# Patient Record
Sex: Female | Born: 1970 | Race: White | Hispanic: No | Marital: Single | Smoking: Never smoker
Health system: Southern US, Community
[De-identification: ages and names within clinical notes are randomized; demographics above are authoritative.]

## PROBLEM LIST (undated history)

## (undated) DIAGNOSIS — R112 Nausea with vomiting, unspecified: Secondary | ICD-10-CM

## (undated) DIAGNOSIS — E7800 Pure hypercholesterolemia, unspecified: Secondary | ICD-10-CM

## (undated) DIAGNOSIS — Z9889 Other specified postprocedural states: Secondary | ICD-10-CM

## (undated) DIAGNOSIS — M25561 Pain in right knee: Secondary | ICD-10-CM

## (undated) DIAGNOSIS — M2550 Pain in unspecified joint: Secondary | ICD-10-CM

## (undated) DIAGNOSIS — R7303 Prediabetes: Secondary | ICD-10-CM

## (undated) DIAGNOSIS — Z87442 Personal history of urinary calculi: Secondary | ICD-10-CM

## (undated) DIAGNOSIS — K259 Gastric ulcer, unspecified as acute or chronic, without hemorrhage or perforation: Secondary | ICD-10-CM

## (undated) DIAGNOSIS — K219 Gastro-esophageal reflux disease without esophagitis: Secondary | ICD-10-CM

## (undated) DIAGNOSIS — N289 Disorder of kidney and ureter, unspecified: Secondary | ICD-10-CM

## (undated) DIAGNOSIS — G5600 Carpal tunnel syndrome, unspecified upper limb: Secondary | ICD-10-CM

## (undated) HISTORY — DX: Pure hypercholesterolemia, unspecified: E78.00

## (undated) HISTORY — PX: OTHER SURGICAL HISTORY: SHX169

## (undated) HISTORY — DX: Gastric ulcer, unspecified as acute or chronic, without hemorrhage or perforation: K25.9

## (undated) HISTORY — DX: Pain in left knee: M25.561

## (undated) HISTORY — DX: Pain in unspecified joint: M25.50

## (undated) HISTORY — DX: Carpal tunnel syndrome, unspecified upper limb: G56.00

## (undated) HISTORY — DX: Gastro-esophageal reflux disease without esophagitis: K21.9

## (undated) HISTORY — PX: KNEE SURGERY: SHX244

## (undated) HISTORY — DX: Prediabetes: R73.03

---

## 1993-10-05 HISTORY — PX: KNEE SURGERY: SHX244

## 2000-08-02 ENCOUNTER — Other Ambulatory Visit: Admission: RE | Admit: 2000-08-02 | Discharge: 2000-08-02 | Payer: Self-pay | Admitting: Gynecology

## 2000-09-07 ENCOUNTER — Other Ambulatory Visit: Admission: RE | Admit: 2000-09-07 | Discharge: 2000-09-07 | Payer: Self-pay | Admitting: Gynecology

## 2000-12-07 ENCOUNTER — Other Ambulatory Visit: Admission: RE | Admit: 2000-12-07 | Discharge: 2000-12-07 | Payer: Self-pay | Admitting: Gynecology

## 2001-08-09 ENCOUNTER — Other Ambulatory Visit: Admission: RE | Admit: 2001-08-09 | Discharge: 2001-08-09 | Payer: Self-pay | Admitting: Gynecology

## 2001-08-18 ENCOUNTER — Encounter: Admission: RE | Admit: 2001-08-18 | Discharge: 2001-08-18 | Payer: Self-pay | Admitting: Gynecology

## 2001-08-18 ENCOUNTER — Encounter: Payer: Self-pay | Admitting: Gynecology

## 2001-11-27 ENCOUNTER — Encounter: Payer: Self-pay | Admitting: *Deleted

## 2001-11-27 ENCOUNTER — Emergency Department (HOSPITAL_COMMUNITY): Admission: EM | Admit: 2001-11-27 | Discharge: 2001-11-27 | Payer: Self-pay | Admitting: *Deleted

## 2002-02-04 ENCOUNTER — Emergency Department (HOSPITAL_COMMUNITY): Admission: EM | Admit: 2002-02-04 | Discharge: 2002-02-04 | Payer: Self-pay | Admitting: Emergency Medicine

## 2002-04-03 ENCOUNTER — Other Ambulatory Visit: Admission: RE | Admit: 2002-04-03 | Discharge: 2002-04-03 | Payer: Self-pay | Admitting: Gynecology

## 2002-11-05 ENCOUNTER — Inpatient Hospital Stay (HOSPITAL_COMMUNITY): Admission: AD | Admit: 2002-11-05 | Discharge: 2002-11-05 | Payer: Self-pay | Admitting: Gynecology

## 2002-11-14 ENCOUNTER — Other Ambulatory Visit: Admission: RE | Admit: 2002-11-14 | Discharge: 2002-11-14 | Payer: Self-pay | Admitting: Gynecology

## 2006-11-03 ENCOUNTER — Other Ambulatory Visit: Admission: RE | Admit: 2006-11-03 | Discharge: 2006-11-03 | Payer: Self-pay | Admitting: Gynecology

## 2006-12-24 ENCOUNTER — Emergency Department (HOSPITAL_COMMUNITY): Admission: EM | Admit: 2006-12-24 | Discharge: 2006-12-24 | Payer: Self-pay | Admitting: Emergency Medicine

## 2008-07-28 ENCOUNTER — Emergency Department (HOSPITAL_COMMUNITY): Admission: EM | Admit: 2008-07-28 | Discharge: 2008-07-28 | Payer: Self-pay | Admitting: Family Medicine

## 2010-10-17 ENCOUNTER — Emergency Department (HOSPITAL_COMMUNITY)
Admission: EM | Admit: 2010-10-17 | Discharge: 2010-10-17 | Payer: Self-pay | Source: Home / Self Care | Admitting: Family Medicine

## 2011-01-25 ENCOUNTER — Inpatient Hospital Stay (INDEPENDENT_AMBULATORY_CARE_PROVIDER_SITE_OTHER)
Admission: RE | Admit: 2011-01-25 | Discharge: 2011-01-25 | Disposition: A | Discharge status: Home / Self Care | Payer: Self-pay | Source: Ambulatory Visit | Attending: Family Medicine | Admitting: Family Medicine

## 2011-01-25 DIAGNOSIS — L509 Urticaria, unspecified: Secondary | ICD-10-CM

## 2011-01-25 DIAGNOSIS — J309 Allergic rhinitis, unspecified: Secondary | ICD-10-CM

## 2011-08-06 ENCOUNTER — Emergency Department (HOSPITAL_COMMUNITY)
Admission: EM | Admit: 2011-08-06 | Discharge: 2011-08-06 | Disposition: A | Discharge status: Home / Self Care | Payer: Self-pay | Attending: Emergency Medicine | Admitting: Emergency Medicine

## 2011-08-06 DIAGNOSIS — R110 Nausea: Secondary | ICD-10-CM | POA: Insufficient documentation

## 2011-08-06 DIAGNOSIS — R1013 Epigastric pain: Secondary | ICD-10-CM | POA: Insufficient documentation

## 2011-08-06 DIAGNOSIS — K219 Gastro-esophageal reflux disease without esophagitis: Secondary | ICD-10-CM | POA: Insufficient documentation

## 2015-03-02 ENCOUNTER — Emergency Department (HOSPITAL_COMMUNITY)
Admission: EM | Admit: 2015-03-02 | Discharge: 2015-03-02 | Disposition: A | Discharge status: Home / Self Care | Payer: Self-pay | Attending: Emergency Medicine | Admitting: Emergency Medicine

## 2015-03-02 ENCOUNTER — Encounter (HOSPITAL_COMMUNITY): Payer: Self-pay

## 2015-03-02 DIAGNOSIS — N200 Calculus of kidney: Secondary | ICD-10-CM | POA: Insufficient documentation

## 2015-03-02 DIAGNOSIS — Z3202 Encounter for pregnancy test, result negative: Secondary | ICD-10-CM | POA: Insufficient documentation

## 2015-03-02 HISTORY — DX: Disorder of kidney and ureter, unspecified: N28.9

## 2015-03-02 LAB — URINALYSIS, ROUTINE W REFLEX MICROSCOPIC
Bilirubin Urine: NEGATIVE
GLUCOSE, UA: NEGATIVE mg/dL
Ketones, ur: NEGATIVE mg/dL
Leukocytes, UA: NEGATIVE
NITRITE: NEGATIVE
PH: 5 (ref 5.0–8.0)
Protein, ur: NEGATIVE mg/dL
Specific Gravity, Urine: 1.03 (ref 1.005–1.030)
Urobilinogen, UA: 0.2 mg/dL (ref 0.0–1.0)

## 2015-03-02 LAB — COMPREHENSIVE METABOLIC PANEL
ALBUMIN: 3.6 g/dL (ref 3.5–5.0)
ALT: 13 U/L — ABNORMAL LOW (ref 14–54)
ANION GAP: 9 (ref 5–15)
AST: 16 U/L (ref 15–41)
Alkaline Phosphatase: 44 U/L (ref 38–126)
BILIRUBIN TOTAL: 0.1 mg/dL — AB (ref 0.3–1.2)
BUN: 17 mg/dL (ref 6–20)
CHLORIDE: 104 mmol/L (ref 101–111)
CO2: 25 mmol/L (ref 22–32)
Calcium: 8.8 mg/dL — ABNORMAL LOW (ref 8.9–10.3)
Creatinine, Ser: 0.86 mg/dL (ref 0.44–1.00)
GFR calc Af Amer: >60 mL/min (ref >60)
GFR calc non Af Amer: >60 mL/min (ref >60)
Glucose, Bld: 112 mg/dL — ABNORMAL HIGH (ref 65–99)
Potassium: 4.1 mmol/L (ref 3.5–5.1)
Sodium: 138 mmol/L (ref 135–145)
Total Protein: 6.5 g/dL (ref 6.5–8.1)

## 2015-03-02 LAB — CBC
HCT: 41.8 % (ref 36.0–46.0)
Hemoglobin: 13.7 g/dL (ref 12.0–15.0)
MCH: 30.4 pg (ref 26.0–34.0)
MCHC: 32.8 g/dL (ref 30.0–36.0)
MCV: 92.9 fL (ref 78.0–100.0)
Platelets: 278 10*3/uL (ref 150–400)
RBC: 4.5 MIL/uL (ref 3.87–5.11)
RDW: 13.6 % (ref 11.5–15.5)
WBC: 6.2 10*3/uL (ref 4.0–10.5)

## 2015-03-02 LAB — URINE MICROSCOPIC-ADD ON

## 2015-03-02 LAB — POC URINE PREG, ED: Preg Test, Ur: NEGATIVE

## 2015-03-02 MED ORDER — KETOROLAC TROMETHAMINE 30 MG/ML IJ SOLN
30.0000 mg | Freq: Once | INTRAMUSCULAR | Status: AC
  Administered 2015-03-02: 30 mg via INTRAVENOUS
  Filled 2015-03-02: qty 1

## 2015-03-02 MED ORDER — OXYCODONE-ACETAMINOPHEN 5-325 MG PO TABS
2.0000 | ORAL_TABLET | Freq: Once | ORAL | Status: AC
  Administered 2015-03-02: 2 via ORAL
  Filled 2015-03-02: qty 2

## 2015-03-02 MED ORDER — MORPHINE SULFATE 4 MG/ML IJ SOLN
4.0000 mg | Freq: Once | INTRAMUSCULAR | Status: AC
  Administered 2015-03-02: 4 mg via INTRAVENOUS
  Filled 2015-03-02: qty 1

## 2015-03-02 MED ORDER — METOCLOPRAMIDE HCL 5 MG/ML IJ SOLN
10.0000 mg | INTRAMUSCULAR | Status: AC
  Administered 2015-03-02: 10 mg via INTRAVENOUS
  Filled 2015-03-02: qty 2

## 2015-03-02 MED ORDER — OXYCODONE-ACETAMINOPHEN 5-325 MG PO TABS: 1.0000 | ORAL_TABLET | Freq: Four times a day (QID) | ORAL | Status: DC | PRN

## 2015-03-02 MED ORDER — ONDANSETRON HCL 4 MG PO TABS: 4.0000 mg | ORAL_TABLET | Freq: Four times a day (QID) | ORAL | Status: DC

## 2015-03-02 NOTE — ED Notes (Signed)
Pt c/o R flank pain starting two days ago, has had kidney stones there in the past, pt states she is unable to void, has been self medicating at home without relief and taking amoxicillin that belongs to mother.

## 2015-03-02 NOTE — ED Provider Notes (Signed)
CSN: [id number]     Arrival date & time 03/02/15  0330 History   First MD Initiated Contact with Patient 03/02/15 [phone]     Chief Complaint  Patient presents with  . Flank Pain    (Consider location/radiation/quality/duration/timing/severity/associated sxs/prior Treatment) HPI Comments: Patient is a 44 year old female with a history of kidney stones who presents to the emergency department for further evaluation of right flank pain. Patient states that she had symptoms mildly for the past 2 days, but they acutely worsened at 0145 this morning. Patient reports a sore pain in her R flank initially which progressed to severe pain in her right groin and right flank. She had some improvement in her pain when upright. This was associated with an urge to urinate as well as diaphoresis and nausea. Patient attempted to take Aleve for pain, without relief. Patient also took 5 tablets of her mother's left over amoxicillin over the course of 48 hours without improvement in her symptoms. She denies associated fever, diarrhea, melanoma or hematochezia, vomiting, or incontinence. She has a history of kidney stones in 2012.  Patient is a 44 y.o. female presenting with flank pain. The history is provided by the patient. No language interpreter was used.  Flank Pain Associated symptoms include abdominal pain (R groin), diaphoresis and nausea. Pertinent negatives include no fever or vomiting.    Past Medical History  Diagnosis Date  . Renal disorder     kidney stones    Past Surgical History  Procedure Laterality Date  . Knee surgery     No family history on file. History  Substance Use Topics  . Smoking status: Never Smoker   . Smokeless tobacco: Never Used  . Alcohol Use: No   OB History    No data available      Review of Systems  Constitutional: Positive for diaphoresis. Negative for fever.  Gastrointestinal: Positive for nausea and abdominal pain (R groin). Negative for vomiting.    Genitourinary: Positive for urgency and flank pain. Negative for dysuria and hematuria.  All other systems reviewed and are negative.   Allergies  Review of patient's allergies indicates no known allergies.  Home Medications   Prior to Admission medications   Medication Sig Start Date End Date Taking? Authorizing Provider  ondansetron (ZOFRAN) 4 MG tablet Take 1 tablet (4 mg total) by mouth every 6 (six) hours. 03/02/15   Antony MaduraKelly Irelynn Schermerhorn, PA-C  oxyCODONE-acetaminophen (PERCOCET/ROXICET) 5-325 MG per tablet Take 1-2 tablets by mouth every 6 (six) hours as needed for severe pain. 03/02/15   Antony MaduraKelly Macy Lingenfelter, PA-C   BP 127/78 mmHg  Pulse 96  Temp(Src) 97.9 F (36.6 C) (Oral)  Resp 24  Ht 5\' 6"  (1.676 m)  Wt 260 lb (117.935 kg)  BMI 41.99 kg/m2  SpO2 97%  LMP 02/09/2015   Physical Exam  Constitutional: She is oriented to person, place, and time. She appears well-developed and well-nourished. No distress.  HENT:  Head: Normocephalic and atraumatic.  Eyes: Conjunctivae and EOM are normal. No scleral icterus.  Neck: Normal range of motion.  Pulmonary/Chest: Effort normal. No respiratory distress.  Abdominal: Soft. Normal appearance. There is tenderness in the right lower quadrant and suprapubic area. There is CVA tenderness (Right). There is no tenderness at McBurney's point and negative Murphy's sign.    Soft abdomen with TTP in the RLQ and R mid abdomen as well as suprapubic region. There is R CVA tenderness to palpation. No peritoneal signs.  Musculoskeletal: Normal range of motion.  Neurological:  She is alert and oriented to person, place, and time.  Skin: Skin is warm and dry. No rash noted. She is not diaphoretic. No erythema. No pallor.  Psychiatric: She has a normal mood and affect. Her behavior is normal.  Nursing note and vitals reviewed.   ED Course  Procedures (including critical care time) Labs Review Labs Reviewed  COMPREHENSIVE METABOLIC PANEL - Abnormal; Notable for the  following:    Glucose, Bld 112 (*)    Calcium 8.8 (*)    ALT 13 (*)    Total Bilirubin 0.1 (*)    All other components within normal limits  URINALYSIS, ROUTINE W REFLEX MICROSCOPIC (NOT AT [facility]) - Abnormal; Notable for the following:    APPearance CLOUDY (*)    Hgb urine dipstick LARGE (*)    All other components within normal limits  URINE MICROSCOPIC-ADD ON - Abnormal; Notable for the following:    Squamous Epithelial / LPF MANY (*)    Bacteria, UA MANY (*)    Crystals CA OXALATE CRYSTALS (*)    All other components within normal limits  CBC  URINALYSIS, ROUTINE W REFLEX MICROSCOPIC (NOT AT [facility])  POC URINE PREG, ED    Imaging Review No results found.   EKG Interpretation None      MDM   Final diagnoses:  Kidney stone    Patient is a 45 year old female presenting to the emergency department for further evaluation of right flank pain which acutely worsened at 0145 this morning. High clinical suspicion for kidney stone. Symptoms consistent with this and patient states that pain feels similar to when she had a kidney stone in 2012. Patient without leukocytosis or evidence of UTI. Serum creatine WNL, vital signs stable and the pt does not have irratractable vomiting. Her pain has improved significantly with morphine and Toradol.   Given her lack of insurance, she voices preference to refrain from imaging if possible. I do not believe emergent imaging will aid in the management of this patient's symptoms, especially given high clinical suspicion for uncomplicated kidney stone. Will d/c home with pain medications and instruction for urology f/u; referral given. Return precautions discussed and provided. Patient agreeable to plan with no unaddressed concerns. Patient discharged in good condition; VSS.   Filed Vitals:   03/02/15 0338 03/02/15 0342  BP:  127/78  Pulse:  96  Temp:  97.9 F (36.6 C)  TempSrc:  Oral  Resp:  24  Height:  (1.676 m)   Weight: 260 lb (117.935  kg)   SpO2:  97%        Antony Madura, PA-C 03/02/15 0517  April Palumbo, MD 03/02/15 [phone]

## 2015-03-02 NOTE — Discharge Instructions (Signed)

## 2016-07-12 ENCOUNTER — Emergency Department (HOSPITAL_COMMUNITY): Payer: Self-pay

## 2016-07-12 ENCOUNTER — Encounter (HOSPITAL_COMMUNITY): Payer: Self-pay

## 2016-07-12 ENCOUNTER — Emergency Department (HOSPITAL_COMMUNITY)
Admission: EM | Admit: 2016-07-12 | Discharge: 2016-07-13 | Disposition: A | Discharge status: Home / Self Care | Payer: Self-pay | Attending: Emergency Medicine | Admitting: Emergency Medicine

## 2016-07-12 DIAGNOSIS — N201 Calculus of ureter: Secondary | ICD-10-CM | POA: Insufficient documentation

## 2016-07-12 DIAGNOSIS — N200 Calculus of kidney: Secondary | ICD-10-CM

## 2016-07-12 DIAGNOSIS — N939 Abnormal uterine and vaginal bleeding, unspecified: Secondary | ICD-10-CM | POA: Insufficient documentation

## 2016-07-12 LAB — CBC WITH DIFFERENTIAL/PLATELET
Basophils Absolute: 0 10*3/uL (ref 0.0–0.1)
Basophils Relative: 0 %
EOS ABS: 0.1 10*3/uL (ref 0.0–0.7)
Eosinophils Relative: 1 %
HCT: 37.1 % (ref 36.0–46.0)
Hemoglobin: 12.2 g/dL (ref 12.0–15.0)
LYMPHS ABS: 1.9 10*3/uL (ref 0.7–4.0)
Lymphocytes Relative: 29 %
MCH: 30.3 pg (ref 26.0–34.0)
MCHC: 32.9 g/dL (ref 30.0–36.0)
MCV: 92.3 fL (ref 78.0–100.0)
Monocytes Absolute: 0.6 10*3/uL (ref 0.1–1.0)
Monocytes Relative: 9 %
Neutro Abs: 4.1 10*3/uL (ref 1.7–7.7)
Neutrophils Relative %: 61 %
PLATELETS: 268 10*3/uL (ref 150–400)
RBC: 4.02 MIL/uL (ref 3.87–5.11)
RDW: 13.3 % (ref 11.5–15.5)
WBC: 6.7 10*3/uL (ref 4.0–10.5)

## 2016-07-12 LAB — COMPREHENSIVE METABOLIC PANEL
ALK PHOS: 39 U/L (ref 38–126)
ALT: 14 U/L (ref 14–54)
AST: 15 U/L (ref 15–41)
Albumin: 3.6 g/dL (ref 3.5–5.0)
Anion gap: 5 (ref 5–15)
BILIRUBIN TOTAL: 0.4 mg/dL (ref 0.3–1.2)
BUN: 13 mg/dL (ref 6–20)
CO2: 25 mmol/L (ref 22–32)
Calcium: 8.4 mg/dL — ABNORMAL LOW (ref 8.9–10.3)
Chloride: 105 mmol/L (ref 101–111)
Creatinine, Ser: 0.75 mg/dL (ref 0.44–1.00)
GFR calc Af Amer: >60 mL/min (ref >60)
Glucose, Bld: 103 mg/dL — ABNORMAL HIGH (ref 65–99)
Potassium: 3.9 mmol/L (ref 3.5–5.1)
Sodium: 135 mmol/L (ref 135–145)
Total Protein: 6.6 g/dL (ref 6.5–8.1)

## 2016-07-12 LAB — URINALYSIS, ROUTINE W REFLEX MICROSCOPIC
Glucose, UA: NEGATIVE mg/dL
Ketones, ur: NEGATIVE mg/dL
LEUKOCYTES UA: NEGATIVE
Nitrite: NEGATIVE
PH: 6.5 (ref 5.0–8.0)
Protein, ur: 30 mg/dL — AB
SPECIFIC GRAVITY, URINE: 1.02 (ref 1.005–1.030)

## 2016-07-12 LAB — URINE MICROSCOPIC-ADD ON

## 2016-07-12 LAB — LIPASE, BLOOD: Lipase: 17 U/L (ref 11–51)

## 2016-07-12 LAB — PREGNANCY, URINE: Preg Test, Ur: NEGATIVE

## 2016-07-12 MED ORDER — KETOROLAC TROMETHAMINE 30 MG/ML IJ SOLN
30.0000 mg | Freq: Once | INTRAMUSCULAR | Status: AC
  Administered 2016-07-12: 30 mg via INTRAVENOUS
  Filled 2016-07-12: qty 1

## 2016-07-12 MED ORDER — SODIUM CHLORIDE 0.9 % IV BOLUS (SEPSIS)
1000.0000 mL | Freq: Once | INTRAVENOUS | Status: AC
  Administered 2016-07-12: 1000 mL via INTRAVENOUS

## 2016-07-12 MED ORDER — ONDANSETRON HCL 4 MG/2ML IJ SOLN
4.0000 mg | Freq: Once | INTRAMUSCULAR | Status: AC
  Administered 2016-07-12: 4 mg via INTRAVENOUS
  Filled 2016-07-12: qty 2

## 2016-07-12 MED ORDER — HYDROMORPHONE HCL 1 MG/ML IJ SOLN
0.5000 mg | Freq: Once | INTRAMUSCULAR | Status: AC
  Administered 2016-07-12: 0.5 mg via INTRAVENOUS
  Filled 2016-07-12: qty 1

## 2016-07-12 MED ORDER — METOCLOPRAMIDE HCL 5 MG/ML IJ SOLN
5.0000 mg | Freq: Once | INTRAMUSCULAR | Status: AC
  Administered 2016-07-12: 5 mg via INTRAVENOUS
  Filled 2016-07-12: qty 2

## 2016-07-12 NOTE — ED Notes (Signed)
Pt returned from CT at this time.  

## 2016-07-12 NOTE — ED Triage Notes (Signed)
Patient states that she has a kidney stone and is hurting on her left side.  States that she took a Hydrocodone around 1630 and that it did not help.  Has seen blood in her urine.  Has a prior history of kidney stones, but never one that has hurt on the left side.

## 2016-07-12 NOTE — ED Provider Notes (Signed)
[facility]-EMERGENCY DEPT Provider Note   CSN: [id number] Arrival date & time: 07/12/16  1939     History   Chief Complaint Chief Complaint  Patient presents with  . Nephrolithiasis    HPI Tiffany Young is a 45 y.o. female.  Tiffany Young is a 45 y.o. female with h/o nephrolithiasis presents to ED with complaint of left flank pain onset this afternoon. Pain was initially intermittent, but has been constant since approximately 3pm. She has aching low back pain with sharp pains throughout her left flank. She reports associated hematuria, nausea, and left sided abdominal pain. She denies fever, trouble swallowing, changes in vision, chest pain, shortness of breath, vomiting, dysuria, vaginal discharge, numbness or weakness, loss of bowel or bladder control. She has tried lemon water with minimal relief. She took a vicodin at approximately 4:30pm with some relief.       Past Medical History:  Diagnosis Date  . Renal disorder    kidney stones     There are no active problems to display for this patient.   Past Surgical History:  Procedure Laterality Date  . KNEE SURGERY      OB History    No data available       Home Medications    Prior to Admission medications   Medication Sig Start Date End Date Taking? Authorizing Provider  cephALEXin (KEFLEX) 500 MG capsule Take 1 capsule (500 mg total) by mouth 2 (two) times daily. 2016/07/18 07/16/16  Lona Kettle, PA-C  HYDROcodone-acetaminophen (NORCO/VICODIN) 5-325 MG tablet Take 1 tablet by mouth every 6 (six) hours as needed. 2016/07/18   Lona Kettle, PA-C  ondansetron (ZOFRAN ODT) 4 MG disintegrating tablet Take 1 tablet (4 mg total) by mouth every 8 (eight) hours as needed for nausea or vomiting. July 18, 2016   Lona Kettle, PA-C  tamsulosin (FLOMAX) 0.4 MG CAPS capsule Take 1 capsule (0.4 mg total) by mouth daily. Until stone has passed 07-18-2016   Lona Kettle, PA-C    Family  History No family history on file.  Social History Social History  Substance Use Topics  . Smoking status: Never Smoker  . Smokeless tobacco: Never Used  . Alcohol use No     Allergies   Review of patient's allergies indicates no known allergies.   Review of Systems Review of Systems  Constitutional: Negative for chills, diaphoresis and fever.  HENT: Negative for trouble swallowing.   Eyes: Negative for visual disturbance.  Respiratory: Negative for shortness of breath.   Cardiovascular: Negative for chest pain.  Gastrointestinal: Positive for abdominal pain ( left abdomen) and nausea. Negative for diarrhea and vomiting.  Genitourinary: Positive for dysuria, flank pain, hematuria and vaginal bleeding ( just finishing menstrual cycle).  Musculoskeletal: Positive for back pain.  Skin: Negative for rash.  Neurological: Negative for weakness, numbness and headaches.     Physical Exam Updated Vital Signs BP 127/70 (BP Location: Left Arm)   Pulse 95   Temp 98 F (36.7 C) (Oral)   Resp 16   Ht 5\' 6"  (1.676 m)   Wt 122.5 kg   LMP 06/30/2016 (Approximate) Comment: NEG U PREG 07/12/16  SpO2 96%   BMI 43.58 kg/m   Physical Exam  Constitutional: She appears well-developed and well-nourished.  HENT:  Head: Normocephalic and atraumatic.  Mouth/Throat: Oropharynx is clear and moist. No oropharyngeal exudate.  Eyes: Conjunctivae and EOM are normal. Pupils are equal, round, and reactive to light. Right eye exhibits no discharge. Left eye  exhibits no discharge. No scleral icterus.  Neck: Normal range of motion and phonation normal. Neck supple. No neck rigidity. Normal range of motion present.  Cardiovascular: Normal rate, regular rhythm, normal heart sounds and intact distal pulses.   No murmur heard. Pulmonary/Chest: Effort normal and breath sounds normal. No stridor. No respiratory distress. She has no wheezes. She has no rales.  Abdominal: Soft. Bowel sounds are normal. She  exhibits no distension. There is no tenderness. There is no rigidity, no rebound, no guarding and no CVA tenderness.  Musculoskeletal: Normal range of motion.  Lymphadenopathy:    She has no cervical adenopathy.  Neurological: She is alert. She is not disoriented. Coordination and gait normal. GCS eye subscore is 4. GCS verbal subscore is 5. GCS motor subscore is 6.  Skin: Skin is warm and dry. No rash noted. She is not diaphoretic.  Psychiatric: She has a normal mood and affect. Her behavior is normal.     ED Treatments / Results  Labs (all labs ordered are listed, but only abnormal results are displayed) Labs Reviewed  URINALYSIS, ROUTINE W REFLEX MICROSCOPIC (NOT AT [facility]) - Abnormal; Notable for the following:       Result Value   Color, Urine RED (*)    APPearance CLOUDY (*)    Hgb urine dipstick LARGE (*)    Bilirubin Urine SMALL (*)    Protein, ur 30 (*)    All other components within normal limits  COMPREHENSIVE METABOLIC PANEL - Abnormal; Notable for the following:    Glucose, Bld 103 (*)    Calcium 8.4 (*)    All other components within normal limits  URINE MICROSCOPIC-ADD ON - Abnormal; Notable for the following:    Squamous Epithelial / LPF 6-30 (*)    Bacteria, UA FEW (*)    All other components within normal limits  URINE CULTURE  CBC WITH DIFFERENTIAL/PLATELET  LIPASE, BLOOD  PREGNANCY, URINE    EKG  EKG Interpretation None       Radiology Ct Renal Stone Study  Result Date: 07/12/2016 CLINICAL DATA:  Sharp constant left-sided flank pain and hematuria. Initial encounter. EXAM: CT ABDOMEN AND PELVIS WITHOUT CONTRAST TECHNIQUE: Multidetector CT imaging of the abdomen and pelvis was performed following the standard protocol without IV contrast. COMPARISON:  CT of the abdomen and pelvis from 12/24/2006 FINDINGS: Lower chest: The visualized lung bases are grossly clear. The visualized portions of the mediastinum are unremarkable. Hepatobiliary: The liver is  unremarkable in appearance. The gallbladder is unremarkable in appearance. The common bile duct remains normal in caliber. Pancreas: The pancreas is within normal limits. Spleen: The spleen is unremarkable in appearance. Adrenals/Urinary Tract: The adrenal glands are unremarkable in appearance. The kidneys are within normal limits. There is no evidence of hydronephrosis. No renal or ureteral stones are identified. No perinephric stranding is seen Minimal left-sided hydronephrosis is noted, with prominence of the left ureter. An obstructing 4 mm stone is noted at the distal left ureter, just above the left vesicoureteral junction. The right kidney is unremarkable in appearance. No nonobstructing renal stones are identified. Stomach/Bowel: The stomach is unremarkable in appearance. The small bowel is within normal limits. The appendix is normal in caliber, without evidence of appendicitis. The colon is unremarkable in appearance. Vascular/Lymphatic: The abdominal aorta is unremarkable in appearance. The inferior vena cava is grossly unremarkable. No retroperitoneal lymphadenopathy is seen. No pelvic sidewall lymphadenopathy is identified. Reproductive: The bladder is mildly distended and within normal limits. The uterus is grossly  unremarkable in appearance. The ovaries are relatively symmetric. No suspicious adnexal masses are seen. Other: No additional soft tissue abnormalities are seen. Musculoskeletal: No acute osseous abnormalities are identified. The visualized musculature is unremarkable in appearance. IMPRESSION: Minimal left-sided hydronephrosis, with an obstructing 4 mm stone noted at the distal left ureter, just above the left vesicoureteral junction. Electronically Signed   By: Roanna Raider M.D.   On: 07/12/2016 23:20    Procedures Procedures (including critical care time)  Medications Ordered in ED Medications  sodium chloride 0.9 % bolus 1,000 mL (0 mLs Intravenous Stopped 07/12/16 2142)    HYDROmorphone (DILAUDID) injection 0.5 mg (0.5 mg Intravenous Given 07/12/16 2049)  ondansetron (ZOFRAN) injection 4 mg (4 mg Intravenous Given 07/12/16 2049)  metoCLOPramide (REGLAN) injection 5 mg (5 mg Intravenous Given 07/12/16 2200)  ketorolac (TORADOL) 30 MG/ML injection 30 mg (30 mg Intravenous Given 07/12/16 2200)  HYDROcodone-acetaminophen (NORCO/VICODIN) 5-325 MG per tablet 1 tablet (1 tablet Oral Given 07/13/16 0116)  cephALEXin (KEFLEX) capsule 500 mg (500 mg Oral Given 07/13/16 0115)     Initial Impression / Assessment and Plan / ED Course  I have reviewed the triage vital signs and the nursing notes.  Pertinent labs & imaging results that were available during my care of the patient were reviewed by me and considered in my medical decision making (see chart for details).  Clinical Course  Value Comment By Time   On re-evaluation patient endorses slight improvement in pain.  Lona Kettle, [place] 10/08 2141  CT Renal Stone Study Reviewed Lona Kettle, PA-C 10/08 2330   On re-evaluation, patient endorses improvement in pain.  Lona Kettle, [place] 10/09 0030    Patient presents to ED with complaint of left flank pain onset today. Patient is afebrile and non-toxic appearing. She appears uncomfortable, pacing the room. Physical exam is re-assuring. Abdomen is soft and non-tender. No CVA tenderness. Given h/o nephrolithiasis and current s/s concern for possible nephrolithiasis. Will check basic labs. IVF and pain medication initiated.   On re-evaluation patient endorses improvement in pain. CBC, CMP, lipase re-assuring. Negative pregnancy test. U/A remarkable for hematuria; some WBC and bacteria noted; however, squamous epithelial are present, patient does endorse some dysuria.  CT remarkable for 4mm stone in left distal ureter above vesicouretral junction with mild left hydronephrosis. Toradol given in ED. Given bacteria in urine and stone consulted urology - given normal  WBC and afebrile no emergent intervention needed at this time, greatly appreciate urology's time and input.   Discussed results and plan with patient. Strain urine. Ibuprofen for pain relief. Rx vicodin for severe pain. Zofran for nausea. Rx tamsulosin to facilitate stone passage. Rx ABX for ?UTI given bacteria and dysuria, will culture urine as well. Strain urine. Follow up with urology given recurrent episodes of nephrolithiasis. Return precautions given. Patient voiced understanding and is agreeable.   Final Clinical Impressions(s) / ED Diagnoses   Final diagnoses:  Nephrolithiasis    New Prescriptions New Prescriptions   CEPHALEXIN (KEFLEX) 500 MG CAPSULE    Take 1 capsule (500 mg total) by mouth 2 (two) times daily.   HYDROCODONE-ACETAMINOPHEN (NORCO/VICODIN) 5-325 MG TABLET    Take 1 tablet by mouth every 6 (six) hours as needed.   ONDANSETRON (ZOFRAN ODT) 4 MG DISINTEGRATING TABLET    Take 1 tablet (4 mg total) by mouth every 8 (eight) hours as needed for nausea or vomiting.   TAMSULOSIN (FLOMAX) 0.4 MG CAPS CAPSULE    Take 1 capsule (0.4  mg total) by mouth daily. Until stone has passed     Lona Kettle, PA-C 07/13/16 0120    Glynn Octave, MD 07/13/16 [phone]

## 2016-07-13 MED ORDER — HYDROCODONE-ACETAMINOPHEN 5-325 MG PO TABS: 1.0000 | ORAL_TABLET | Freq: Four times a day (QID) | ORAL | 0 refills | Status: DC | PRN

## 2016-07-13 MED ORDER — CEPHALEXIN 500 MG PO CAPS
500.0000 mg | ORAL_CAPSULE | Freq: Once | ORAL | Status: AC
  Administered 2016-07-13: 500 mg via ORAL
  Filled 2016-07-13: qty 1

## 2016-07-13 MED ORDER — CEPHALEXIN 500 MG PO CAPS: 500.0000 mg | ORAL_CAPSULE | Freq: Two times a day (BID) | ORAL | 0 refills | Status: AC

## 2016-07-13 MED ORDER — ONDANSETRON 4 MG PO TBDP: 4.0000 mg | ORAL_TABLET | Freq: Three times a day (TID) | ORAL | 0 refills | Status: DC | PRN

## 2016-07-13 MED ORDER — HYDROCODONE-ACETAMINOPHEN 5-325 MG PO TABS
1.0000 | ORAL_TABLET | Freq: Once | ORAL | Status: AC
  Administered 2016-07-13: 1 via ORAL
  Filled 2016-07-13: qty 1

## 2016-07-13 MED ORDER — TAMSULOSIN HCL 0.4 MG PO CAPS: 0.4000 mg | ORAL_CAPSULE | Freq: Every day | ORAL | 0 refills | Status: DC

## 2016-07-13 NOTE — Discharge Instructions (Signed)
Read the information below.  You have a kidney stone. Because you are having some burning with urination and do show some bacteria in your urine, you are being started on antibiotics. You can take ibuprofen 400mg  every 6hrs as needed. I have prescribed vicodin for severe pain. I have also prescribed zofran for nausea. I have prescribed tamsulosin to help facilitate passing the stone.  It is important that you strain your urine.  It is also important that you follow up with a urologist for re-evaluation, I have provided the contact information above. Please call on Monday to schedule an appointment.  Use the prescribed medication as directed.  Please discuss all new medications with your pharmacist.   You may return to the Emergency Department at any time for worsening condition or any new symptoms that concern you. Return to ED if you develop fever, unable to keep food/fluids down, are not able to urinate, or develop and other new/concerning symptoms.

## 2016-07-14 LAB — URINE CULTURE

## 2019-03-26 ENCOUNTER — Emergency Department (HOSPITAL_COMMUNITY): Payer: Self-pay

## 2019-03-26 ENCOUNTER — Emergency Department (HOSPITAL_COMMUNITY)
Admission: EM | Admit: 2019-03-26 | Discharge: 2019-03-26 | Disposition: A | Discharge status: Home / Self Care | Payer: Self-pay | Attending: Emergency Medicine | Admitting: Emergency Medicine

## 2019-03-26 ENCOUNTER — Other Ambulatory Visit: Payer: Self-pay

## 2019-03-26 ENCOUNTER — Emergency Department (HOSPITAL_COMMUNITY): Admission: EM | Admit: 2019-03-26 | Discharge: 2019-03-26 | Discharge status: Absent without leave | Payer: Self-pay

## 2019-03-26 DIAGNOSIS — N201 Calculus of ureter: Secondary | ICD-10-CM | POA: Insufficient documentation

## 2019-03-26 LAB — COMPREHENSIVE METABOLIC PANEL
ALT: 15 U/L (ref 0–44)
AST: 18 U/L (ref 15–41)
Albumin: 3.9 g/dL (ref 3.5–5.0)
Alkaline Phosphatase: 41 U/L (ref 38–126)
Anion gap: 11 (ref 5–15)
BUN: 12 mg/dL (ref 6–20)
CO2: 24 mmol/L (ref 22–32)
Calcium: 8.9 mg/dL (ref 8.9–10.3)
Chloride: 102 mmol/L (ref 98–111)
Creatinine, Ser: 0.8 mg/dL (ref 0.44–1.00)
GFR calc Af Amer: >60 mL/min (ref >60)
GFR calc non Af Amer: >60 mL/min (ref >60)
Glucose, Bld: 104 mg/dL — ABNORMAL HIGH (ref 70–99)
Potassium: 4 mmol/L (ref 3.5–5.1)
Sodium: 137 mmol/L (ref 135–145)
Total Bilirubin: 0.6 mg/dL (ref 0.3–1.2)
Total Protein: 7 g/dL (ref 6.5–8.1)

## 2019-03-26 LAB — CBC WITH DIFFERENTIAL/PLATELET
Abs Immature Granulocytes: 0.01 10*3/uL (ref 0.00–0.07)
Basophils Absolute: 0 10*3/uL (ref 0.0–0.1)
Basophils Relative: 1 %
Eosinophils Absolute: 0 10*3/uL (ref 0.0–0.5)
Eosinophils Relative: 1 %
HCT: 39.9 % (ref 36.0–46.0)
Hemoglobin: 13.3 g/dL (ref 12.0–15.0)
Immature Granulocytes: 0 %
Lymphocytes Relative: 29 %
Lymphs Abs: 1.5 10*3/uL (ref 0.7–4.0)
MCH: 31.2 pg (ref 26.0–34.0)
MCHC: 33.3 g/dL (ref 30.0–36.0)
MCV: 93.7 fL (ref 80.0–100.0)
Monocytes Absolute: 0.3 10*3/uL (ref 0.1–1.0)
Monocytes Relative: 7 %
Neutro Abs: 3.2 10*3/uL (ref 1.7–7.7)
Neutrophils Relative %: 62 %
Platelets: 259 10*3/uL (ref 150–400)
RBC: 4.26 MIL/uL (ref 3.87–5.11)
RDW: 12.8 % (ref 11.5–15.5)
WBC: 5 10*3/uL (ref 4.0–10.5)
nRBC: 0 % (ref 0.0–0.2)

## 2019-03-26 LAB — URINALYSIS, ROUTINE W REFLEX MICROSCOPIC
Bilirubin Urine: NEGATIVE
Glucose, UA: NEGATIVE mg/dL
Ketones, ur: NEGATIVE mg/dL
Leukocytes,Ua: NEGATIVE
Nitrite: NEGATIVE
Protein, ur: NEGATIVE mg/dL
RBC / HPF: >50 RBC/hpf — ABNORMAL HIGH (ref 0–5)
Specific Gravity, Urine: 1.025 (ref 1.005–1.030)
pH: 5 (ref 5.0–8.0)

## 2019-03-26 LAB — LIPASE, BLOOD: Lipase: 25 U/L (ref 11–51)

## 2019-03-26 LAB — POC URINE PREG, ED: Preg Test, Ur: NEGATIVE

## 2019-03-26 MED ORDER — ONDANSETRON HCL 4 MG PO TABS: 4.0000 mg | ORAL_TABLET | Freq: Three times a day (TID) | ORAL | 0 refills | Status: DC | PRN

## 2019-03-26 MED ORDER — KETOROLAC TROMETHAMINE 30 MG/ML IJ SOLN
30.0000 mg | Freq: Once | INTRAMUSCULAR | Status: AC
  Administered 2019-03-26: 30 mg via INTRAVENOUS
  Filled 2019-03-26: qty 1

## 2019-03-26 MED ORDER — HYDROCODONE-ACETAMINOPHEN 5-325 MG PO TABS: 1.0000 | ORAL_TABLET | Freq: Four times a day (QID) | ORAL | 0 refills | Status: DC | PRN

## 2019-03-26 MED ORDER — ONDANSETRON HCL 4 MG/2ML IJ SOLN
4.0000 mg | Freq: Once | INTRAMUSCULAR | Status: AC
  Administered 2019-03-26: 4 mg via INTRAVENOUS
  Filled 2019-03-26: qty 2

## 2019-03-26 MED ORDER — IBUPROFEN 600 MG PO TABS: 600.0000 mg | ORAL_TABLET | Freq: Four times a day (QID) | ORAL | 0 refills | Status: DC | PRN

## 2019-03-26 NOTE — ED Triage Notes (Signed)
History of kidney stone.  Pain started with severe back on left yesterday.  Rates pain 5/10.  Took two of her daughters medications.  Took one Flomax yesterday, Zofran this am and oxycodone this am.  No blood in urine.

## 2019-03-26 NOTE — ED Provider Notes (Signed)
[facility] EMERGENCY DEPARTMENT Provider Note   CSN: [id number] Arrival date & time: 03/26/19  1330    History   Chief Complaint Chief Complaint  Patient presents with   Flank Pain    left    HPI Tiffany Young is a 48 y.o. female with history of nephrolithiasis presents today for evaluation of acute onset, progressively worsening back pain and left flank pain since yesterday.  She reports that pain is intermittent, sharp and aching, worsens by sitting still when it is its most severe.  She reports the pain goes across her entire back and at times radiates to the lower abdomen and left side.  She notes nausea but no vomiting.  She states that for a few days leading up to her pain she was also having some urinary symptoms including urgency and frequency.  No dysuria or hematuria.  She reports that when her urinary symptoms began she took a couple of tablets of doxycycline from her friend thinking that she might have had a UTI.  Yesterday she took half a tablet of Percocet, Flomax, and Zofran with some relief in her symptoms.  She took another half a tablet of Percocet this morning.  Reports this feels similar to prior kidney stone pains.     The history is provided by the patient.    Past Medical History:  Diagnosis Date   Renal disorder    kidney stones     There are no active problems to display for this patient.   Past Surgical History:  Procedure Laterality Date   KNEE SURGERY       OB History   No obstetric history on file.      Home Medications    Prior to Admission medications   Medication Sig Start Date End Date Taking? Authorizing Provider  cetirizine (ZYRTEC) 10 MG chewable tablet Chew 10 mg by mouth daily.   Yes [provider]  naproxen sodium (ALEVE) 220 MG tablet Take 440 mg by mouth daily as needed.   Yes [provider]  ondansetron (ZOFRAN ODT) 4 MG disintegrating tablet Take 1 tablet (4 mg total) by mouth every 8  (eight) hours as needed for nausea or vomiting. 07/13/16  Yes Deborha PaymentMeyer, Ashley L, PA-C  tamsulosin (FLOMAX) 0.4 MG CAPS capsule Take 1 capsule (0.4 mg total) by mouth daily. Until stone has passed Patient taking differently: Take 0.4 mg by mouth once. Until stone has passed 07/13/16  Yes Daphane ShepherdMeyer, Margaretmary DysAshley L, PA-C  HYDROcodone-acetaminophen (NORCO/VICODIN) 5-325 MG tablet Take 1 tablet by mouth every 6 (six) hours as needed for severe pain. 03/26/19   Efosa Treichler A, PA-C  ibuprofen (ADVIL) 600 MG tablet Take 1 tablet (600 mg total) by mouth every 6 (six) hours as needed. 03/26/19   Luevenia MaxinFawze, Tari Lecount A, PA-C  ondansetron (ZOFRAN) 4 MG tablet Take 1 tablet (4 mg total) by mouth every 8 (eight) hours as needed for nausea or vomiting. 03/26/19   Jeanie SewerFawze, Leighanna Kirn A, PA-C    Family History No family history on file.  Social History Social History   Tobacco Use   Smoking status: Never Smoker   Smokeless tobacco: Never Used  Substance Use Topics   Alcohol use: No   Drug use: Not on file     Allergies   Patient has no known allergies.   Review of Systems Review of Systems  Constitutional: Negative for chills and fever.  Respiratory: Negative for shortness of breath.   Cardiovascular: Negative for chest pain.  Gastrointestinal: Positive for abdominal pain, constipation and nausea. Negative for diarrhea and vomiting.  Genitourinary: Positive for flank pain, frequency and urgency. Negative for dysuria and hematuria.  All other systems reviewed and are negative.    Physical Exam Updated Vital Signs BP 127/65    Pulse 86    Temp 98.7 F (37.1 C) (Oral)    Resp 16    Ht 5\' 6"  (1.676 m)    Wt 114.3 kg    SpO2 97%    BMI 40.67 kg/m   Physical Exam Vitals signs and nursing note reviewed.  Constitutional:      General: She is not in acute distress.    Appearance: She is well-developed.  HENT:     Head: Normocephalic and atraumatic.  Eyes:     General:        Right eye: No discharge.        Left eye:  No discharge.     Conjunctiva/sclera: Conjunctivae normal.  Neck:     Vascular: No JVD.     Trachea: No tracheal deviation.  Cardiovascular:     Rate and Rhythm: Normal rate and regular rhythm.  Pulmonary:     Effort: Pulmonary effort is normal.     Breath sounds: Normal breath sounds.  Abdominal:     General: Abdomen is protuberant. Bowel sounds are normal. There is no distension.     Palpations: Abdomen is soft.     Tenderness: There is abdominal tenderness in the suprapubic area, left upper quadrant and left lower quadrant. There is left CVA tenderness. There is no right CVA tenderness, guarding or rebound.  Musculoskeletal:       Arms:  Skin:    General: Skin is warm and dry.     Findings: No erythema.  Neurological:     Mental Status: She is alert.  Psychiatric:        Behavior: Behavior normal.      ED Treatments / Results  Labs (all labs ordered are listed, but only abnormal results are displayed) Labs Reviewed  COMPREHENSIVE METABOLIC PANEL - Abnormal; Notable for the following components:      Result Value   Glucose, Bld 104 (*)    All other components within normal limits  URINALYSIS, ROUTINE W REFLEX MICROSCOPIC - Abnormal; Notable for the following components:   APPearance HAZY (*)    Hgb urine dipstick LARGE (*)    RBC / HPF >50 (*)    Bacteria, UA FEW (*)    All other components within normal limits  URINE CULTURE  CBC WITH DIFFERENTIAL/PLATELET  LIPASE, BLOOD  POC URINE PREG, ED    EKG None  Radiology Ct Renal Stone Study  Result Date: 03/26/2019 CLINICAL DATA:  48 year old female with acute LEFT flank and abdominal pain for 1 day. EXAM: CT ABDOMEN AND PELVIS WITHOUT CONTRAST TECHNIQUE: Multidetector CT imaging of the abdomen and pelvis was performed following the standard protocol without IV contrast. COMPARISON:  07/12/2016 FINDINGS: Please note that parenchymal abnormalities may be missed without intravenous contrast. Lower chest: No acute  abnormality Hepatobiliary: The liver and gallbladder are unremarkable. No biliary dilatation. Pancreas: Unremarkable Spleen: Unremarkable Adrenals/Urinary Tract: A 3 mm LEFT UVJ calculus causes mild LEFT hydroureteronephrosis. No other urinary calculi are identified. The kidneys, adrenal glands and bladder are otherwise unremarkable. Stomach/Bowel: Stomach is within normal limits. Appendix appears normal. No evidence of bowel wall thickening, distention, or inflammatory changes. Vascular/Lymphatic: No significant vascular findings are present. No enlarged abdominal or pelvic lymph nodes.  Reproductive: Uterus and bilateral adnexa are unremarkable. Other: No ascites, focal collection or pneumoperitoneum. A small umbilical hernia containing fat is noted. Musculoskeletal: No acute or suspicious bony abnormalities are identified. IMPRESSION: 1. 3 mm LEFT UVJ calculus causing mild LEFT hydroureteronephrosis. Electronically Signed   By: Harmon PierJeffrey  Hu M.D.   On: 03/26/2019 15:36    Procedures Procedures (including critical care time)  Medications Ordered in ED Medications  ketorolac (TORADOL) 30 MG/ML injection 30 mg (30 mg Intravenous Given 03/26/19 1417)  ondansetron (ZOFRAN) injection 4 mg (4 mg Intravenous Given 03/26/19 1414)     Initial Impression / Assessment and Plan / ED Course  I have reviewed the triage vital signs and the nursing notes.  Pertinent labs & imaging results that were available during my care of the patient were reviewed by me and considered in my medical decision making (see chart for details).        Patient presenting for evaluation of left flank pain and urinary symptoms.  History of kidney stones and reports this feels similar.  She is afebrile, initially mildly hypertensive with resolution on reevaluation.  Benign abdominal examination with no peritoneal signs.  Lab work shows no leukocytosis, no anemia, no metabolic derangements, no renal insufficiency.  Lipase, creatinine,  LFTs are within normal limits.  Her UA shows blood but no signs of UTI, consistent with nephrolithiasis.  Renal stone study shows a 3 mm left UVJ calculus causing mild left hydroureteronephrosis.  No evidence of acute surgical abdominal pathology including obstruction, perforation, appendicitis, cholecystitis, dissection, PID, TOA, ovarian torsion, ectopic pregnancy.  On reevaluation patient resting, no apparent distress.  Serial abdominal examinations remain benign.  Her pain has been well controlled while she is been in the ED.  She is tolerating p.o. fluids without difficulty.  Recommend follow-up with a urologist for reevaluation of her symptoms.  She reports it has been a few years since she has had a physical with a PCP so I will also give her resources for follow-up with a primary care provider.  Will discharge with a small amount of hydrocodone for severe breakthrough pain, [place] controlled substance registry was queried with no inconsistencies. Discussed appropriate use of medications and side effects. Discussed strict ED return precautions. Patient verbalized understanding of and agreement with plan and is safe for discharge home at this time.   Final Clinical Impressions(s) / ED Diagnoses   Final diagnoses:  Ureterolithiasis    ED Discharge Orders         Ordered    ibuprofen (ADVIL) 600 MG tablet  Every 6 hours PRN     03/26/19 1625    HYDROcodone-acetaminophen (NORCO/VICODIN) 5-325 MG tablet  Every 6 hours PRN     03/26/19 1625    ondansetron (ZOFRAN) 4 MG tablet  Every 8 hours PRN     03/26/19 1625           Jeanie SewerFawze, Dann Ventress A, PA-C 03/26/19 1651    Melene PlanFloyd, Dan, DO 03/28/19 [phone]

## 2019-03-26 NOTE — Discharge Instructions (Addendum)
You can alternate 600 mg of ibuprofen and [phone] mg of Tylenol every 3 hours as needed for pain. Do not exceed 4000 mg of Tylenol daily.  Take ibuprofen with food to avoid upset stomach issues.  You can take hydrocodone as needed for severe pain but do not drive, drink alcohol, or operate heavy machinery while taking this medicine as it may make you drowsy.  You can also cut these tablets in half if they are very strong or sedating.  They also cause constipation, so take a stool softener with him to avoid the side effect.  You can also take Zofran as needed for nausea and vomiting.  Wait around 10 to 15 minutes before having anything to eat or drink to give this medicine time to work.  Plenty of water and get plenty of rest.  Follow-up with a urologist will be important.   I have also attached information for follow-up with a primary care provider.  You can call the offices and tell them you were referred from the emergency department.  They may put you on a waiting list.  I have found that primary care at [facility] in [place] has been the quickest to schedule patients but this may vary from week to week.  Return to the emergency department if any concerning signs or symptoms develop such as high fevers, persistent vomiting, or worsening pain.

## 2019-03-27 LAB — URINE CULTURE: Culture: <10000 — AB

## 2019-12-15 ENCOUNTER — Ambulatory Visit: Payer: Self-pay | Admitting: Family Medicine

## 2019-12-27 ENCOUNTER — Telehealth: Payer: Self-pay | Admitting: Nurse Practitioner

## 2020-01-03 ENCOUNTER — Other Ambulatory Visit: Payer: Self-pay | Admitting: Physician Assistant

## 2020-01-03 DIAGNOSIS — Z1231 Encounter for screening mammogram for malignant neoplasm of breast: Secondary | ICD-10-CM

## 2020-01-22 ENCOUNTER — Other Ambulatory Visit: Payer: Self-pay

## 2020-01-22 ENCOUNTER — Ambulatory Visit
Admission: RE | Admit: 2020-01-22 | Discharge: 2020-01-22 | Disposition: A | Discharge status: Home / Self Care | Payer: 59 | Source: Ambulatory Visit | Attending: Physician Assistant | Admitting: Physician Assistant

## 2020-01-22 DIAGNOSIS — Z1231 Encounter for screening mammogram for malignant neoplasm of breast: Secondary | ICD-10-CM

## 2020-01-23 ENCOUNTER — Other Ambulatory Visit: Payer: Self-pay | Admitting: Physician Assistant

## 2020-01-23 DIAGNOSIS — R928 Other abnormal and inconclusive findings on diagnostic imaging of breast: Secondary | ICD-10-CM

## 2020-01-26 ENCOUNTER — Other Ambulatory Visit: Payer: Self-pay

## 2020-01-26 ENCOUNTER — Ambulatory Visit
Admission: RE | Admit: 2020-01-26 | Discharge: 2020-01-26 | Disposition: A | Discharge status: Home / Self Care | Payer: 59 | Source: Ambulatory Visit | Attending: Physician Assistant | Admitting: Physician Assistant

## 2020-01-26 DIAGNOSIS — R928 Other abnormal and inconclusive findings on diagnostic imaging of breast: Secondary | ICD-10-CM

## 2020-03-01 ENCOUNTER — Other Ambulatory Visit: Payer: Self-pay

## 2020-03-01 ENCOUNTER — Encounter: Payer: Self-pay | Admitting: Plastic Surgery

## 2020-03-01 ENCOUNTER — Ambulatory Visit (INDEPENDENT_AMBULATORY_CARE_PROVIDER_SITE_OTHER): Payer: 59 | Admitting: Plastic Surgery

## 2020-03-01 DIAGNOSIS — S21001A Unspecified open wound of right breast, initial encounter: Secondary | ICD-10-CM | POA: Insufficient documentation

## 2020-03-01 DIAGNOSIS — M542 Cervicalgia: Secondary | ICD-10-CM | POA: Insufficient documentation

## 2020-03-01 DIAGNOSIS — M546 Pain in thoracic spine: Secondary | ICD-10-CM

## 2020-03-01 DIAGNOSIS — G8929 Other chronic pain: Secondary | ICD-10-CM

## 2020-03-01 DIAGNOSIS — N62 Hypertrophy of breast: Secondary | ICD-10-CM | POA: Diagnosis not present

## 2020-03-01 DIAGNOSIS — M549 Dorsalgia, unspecified: Secondary | ICD-10-CM | POA: Insufficient documentation

## 2020-03-01 HISTORY — DX: Hypertrophy of breast: N62

## 2020-03-01 NOTE — Progress Notes (Addendum)
Patient ID: Tiffany Young. Fitzsimons-Lovelace, female    DOB: 25-Apr-1971, 49 y.o.   MRN: [id number]   Chief Complaint  Patient presents with  . Advice Only    for (B) breast reduction    Mammary Hyperplasia: The patient is a 49 y.o. female with a history of mammary hyperplasia for several years.  She has extremely large breasts causing symptoms that include the following: Back pain in the upper and lower back, including neck pain. She pulls or pins her bra straps to provide better lift and relief of the pressure and pain. She notices relief by holding her breast up manually.  Her shoulder straps cause grooves and pain and pressure that requires padding for relief. Pain medication is sometimes required with motrin and tylenol.  Activities that are hindered by enlarged breasts include: exercise and running.  She denies any tobacco use.  She is prediabetic.  She is a Engineer, civil (consulting).  Her breasts are extremely large and fairly symmetric.  She has hyperpigmentation of the inframammary area on both sides.  The sternal to nipple distance on the right is 43 cm and the left is 44 cm.  The IMF distance is 22 cm.  She is 5 feet 6 inches tall and weighs 269 pounds.  She has done an amazing job of losing 50 pounds past year and keeping it off.  Preoperative bra size = 42 F cup.  The estimated excess breast tissue to be removed at the time of surgery = 1200 grams on the left and 1200 grams on the right.  Mammogram history: April 2021 and was negative she is not a smoker.  She has been temporary improvement she is hoping to be around a C she does have aunt and a cousin.   Review of Systems  Constitutional: Positive for activity change. Negative for appetite change.  HENT: Negative.   Eyes: Negative.   Respiratory: Negative.  Negative for chest tightness.   Cardiovascular: Negative.   Gastrointestinal: Negative.  Negative for abdominal pain.  Endocrine: Negative.   Genitourinary: Negative.   Musculoskeletal:  Positive for back pain and neck pain.  Skin: Negative.   Hematological: Negative.   Psychiatric/Behavioral: Negative.     Past Medical History:  Diagnosis Date  . Renal disorder    kidney stones   . Symptomatic mammary hypertrophy 03/01/2020    Past Surgical History:  Procedure Laterality Date  . KNEE SURGERY        Current Outpatient Medications:  .  atorvastatin (LIPITOR) 20 MG tablet, Take 20 mg by mouth daily., Disp: , Rfl:  .  cetirizine (ZYRTEC) 10 MG chewable tablet, Chew 10 mg by mouth daily., Disp: , Rfl:  .  naproxen sodium (ALEVE) 220 MG tablet, Take 440 mg by mouth daily as needed., Disp: , Rfl:    Objective:   Vitals:   03/01/20 1113  BP: 127/83  Pulse: 99  Temp: 97.7 F (36.5 C)  SpO2: 97%    Physical Exam Vitals and nursing note reviewed.  Constitutional:      Appearance: Normal appearance.  HENT:     Head: Normocephalic and atraumatic.  Cardiovascular:     Rate and Rhythm: Normal rate.     Pulses: Normal pulses.  Pulmonary:     Effort: Pulmonary effort is normal. No respiratory distress.  Abdominal:     General: Abdomen is flat. There is no distension.     Tenderness: There is no abdominal tenderness.  Neurological:     General:  No focal deficit present.     Mental Status: She is alert and oriented to person, place, and time.  Psychiatric:        Mood and Affect: Mood normal.        Behavior: Behavior normal.        Thought Content: Thought content normal.     Assessment & Plan:  Neck pain  Chronic bilateral thoracic back pain  Symptomatic mammary hypertrophy  Recommend bilateral breast reduction.  I think a breast reduction would be an incredible help for her neck and back pain.  We discussed the risks and complications which include change in nipple areola sensation and slow healing to name a few that would be a little bit more common for her.  We will request release of information for her mammogram and physical therapy  notes.  Pictures were obtained of the patient and placed in the chart with the patient's or guardian's permission.   Schley, DO

## 2020-03-06 ENCOUNTER — Telehealth: Payer: Self-pay

## 2020-03-06 NOTE — Telephone Encounter (Signed)
Faxed medical release form to [facility] for office notes 4/21-current

## 2020-03-15 ENCOUNTER — Other Ambulatory Visit: Payer: Self-pay | Admitting: Physician Assistant

## 2020-03-15 ENCOUNTER — Other Ambulatory Visit (HOSPITAL_COMMUNITY)
Admission: RE | Admit: 2020-03-15 | Discharge: 2020-03-15 | Disposition: A | Discharge status: Home / Self Care | Payer: 59 | Source: Ambulatory Visit | Attending: Physician Assistant | Admitting: Physician Assistant

## 2020-03-15 DIAGNOSIS — Z124 Encounter for screening for malignant neoplasm of cervix: Secondary | ICD-10-CM | POA: Insufficient documentation

## 2020-03-20 LAB — CYTOLOGY - PAP
Adequacy: ABSENT
Comment: NEGATIVE
Diagnosis: NEGATIVE
High risk HPV: NEGATIVE

## 2020-04-14 ENCOUNTER — Emergency Department (HOSPITAL_COMMUNITY)
Admission: EM | Admit: 2020-04-14 | Discharge: 2020-04-14 | Disposition: A | Discharge status: Home / Self Care | Payer: 59 | Attending: Emergency Medicine | Admitting: Emergency Medicine

## 2020-04-14 ENCOUNTER — Other Ambulatory Visit: Payer: Self-pay

## 2020-04-14 ENCOUNTER — Encounter (HOSPITAL_COMMUNITY): Payer: Self-pay | Admitting: Emergency Medicine

## 2020-04-14 ENCOUNTER — Emergency Department (HOSPITAL_COMMUNITY): Payer: 59

## 2020-04-14 DIAGNOSIS — N12 Tubulo-interstitial nephritis, not specified as acute or chronic: Secondary | ICD-10-CM

## 2020-04-14 DIAGNOSIS — R109 Unspecified abdominal pain: Secondary | ICD-10-CM | POA: Diagnosis present

## 2020-04-14 DIAGNOSIS — N10 Acute tubulo-interstitial nephritis: Secondary | ICD-10-CM | POA: Diagnosis not present

## 2020-04-14 DIAGNOSIS — Z79899 Other long term (current) drug therapy: Secondary | ICD-10-CM | POA: Insufficient documentation

## 2020-04-14 DIAGNOSIS — Z87442 Personal history of urinary calculi: Secondary | ICD-10-CM | POA: Insufficient documentation

## 2020-04-14 LAB — CBC
HCT: 42.3 % (ref 36.0–46.0)
Hemoglobin: 13.8 g/dL (ref 12.0–15.0)
MCH: 31.3 pg (ref 26.0–34.0)
MCHC: 32.6 g/dL (ref 30.0–36.0)
MCV: 95.9 fL (ref 80.0–100.0)
Platelets: 252 10*3/uL (ref 150–400)
RBC: 4.41 MIL/uL (ref 3.87–5.11)
RDW: 12.3 % (ref 11.5–15.5)
WBC: 5.8 10*3/uL (ref 4.0–10.5)
nRBC: 0 % (ref 0.0–0.2)

## 2020-04-14 LAB — URINALYSIS, ROUTINE W REFLEX MICROSCOPIC
Bilirubin Urine: NEGATIVE
Glucose, UA: NEGATIVE mg/dL
Hgb urine dipstick: NEGATIVE
Ketones, ur: NEGATIVE mg/dL
Nitrite: NEGATIVE
Protein, ur: NEGATIVE mg/dL
Specific Gravity, Urine: 1.024 (ref 1.005–1.030)
pH: 5 (ref 5.0–8.0)

## 2020-04-14 LAB — BASIC METABOLIC PANEL
Anion gap: 7 (ref 5–15)
BUN: 15 mg/dL (ref 6–20)
CO2: 29 mmol/L (ref 22–32)
Calcium: 8.9 mg/dL (ref 8.9–10.3)
Chloride: 104 mmol/L (ref 98–111)
Creatinine, Ser: 0.74 mg/dL (ref 0.44–1.00)
GFR calc Af Amer: >60 mL/min (ref >60)
GFR calc non Af Amer: >60 mL/min (ref >60)
Glucose, Bld: 97 mg/dL (ref 70–99)
Potassium: 4.1 mmol/L (ref 3.5–5.1)
Sodium: 140 mmol/L (ref 135–145)

## 2020-04-14 MED ORDER — HYDROMORPHONE HCL 1 MG/ML IJ SOLN
0.5000 mg | Freq: Once | INTRAMUSCULAR | Status: AC
  Administered 2020-04-14: 0.5 mg via INTRAVENOUS
  Filled 2020-04-14: qty 1

## 2020-04-14 MED ORDER — SODIUM CHLORIDE 0.9 % IV SOLN
1.0000 g | Freq: Once | INTRAVENOUS | Status: AC
  Administered 2020-04-14: 1 g via INTRAVENOUS
  Filled 2020-04-14: qty 10

## 2020-04-14 MED ORDER — SODIUM CHLORIDE 0.9 % IV BOLUS
1000.0000 mL | Freq: Once | INTRAVENOUS | Status: AC
  Administered 2020-04-14: 1000 mL via INTRAVENOUS

## 2020-04-14 MED ORDER — HYDROCODONE-ACETAMINOPHEN 5-325 MG PO TABS: 1.0000 | ORAL_TABLET | Freq: Four times a day (QID) | ORAL | 0 refills | Status: DC | PRN

## 2020-04-14 MED ORDER — ONDANSETRON HCL 4 MG/2ML IJ SOLN
4.0000 mg | Freq: Once | INTRAMUSCULAR | Status: AC
  Administered 2020-04-14: 4 mg via INTRAVENOUS
  Filled 2020-04-14: qty 2

## 2020-04-14 MED ORDER — CEPHALEXIN 500 MG PO CAPS: 500.0000 mg | ORAL_CAPSULE | Freq: Four times a day (QID) | ORAL | 0 refills | Status: DC

## 2020-04-14 NOTE — Discharge Instructions (Signed)
Take the antibiotic Keflex as directed for the next 7 days.  Would expect significant improvement in your pain and symptoms over the next 24 to 48 hours.  Return for any new or worse symptoms or if not improving.  Also would recommend taking Aleve for the pain.  Urine culture has been sent to confirm what type of bacteria may be growing in the urine.  However Keflex a good choice and most likely will be effective.

## 2020-04-14 NOTE — ED Notes (Signed)
Family member at bedside.

## 2020-04-14 NOTE — ED Provider Notes (Signed)
[facility] EMERGENCY DEPARTMENT Provider Note   CSN: [id number] Arrival date & time: 04/14/20  1428     History Chief Complaint  Patient presents with  . Back Pain    Tiffany Young is a 49 y.o. female.  Patient with the onset of right flank pain overnight.  Is been constant but not getting worse.  History of kidney stones.  She is normally a kidney stone pain gets worse this is kind of remained steady.  No dysuria no nausea vomiting no hematuria.  No fever.        Past Medical History:  Diagnosis Date  . Renal disorder    kidney stones   . Symptomatic mammary hypertrophy 03/01/2020    Patient Active Problem List   Diagnosis Date Noted  . Neck pain 03/01/2020  . Back pain 03/01/2020  . Symptomatic mammary hypertrophy 03/01/2020    Past Surgical History:  Procedure Laterality Date  . KNEE SURGERY       OB History   No obstetric history on file.     Family History  Problem Relation Age of Onset  . Breast cancer Paternal Aunt   . Breast cancer Cousin     Social History   Tobacco Use  . Smoking status: Never Smoker  . Smokeless tobacco: Never Used  Vaping Use  . Vaping Use: Never used  Substance Use Topics  . Alcohol use: No  . Drug use: Not Currently    Home Medications Prior to Admission medications   Medication Sig Start Date End Date Taking? Authorizing Provider  atorvastatin (LIPITOR) 20 MG tablet Take 20 mg by mouth daily. 01/03/20   [provider]  cetirizine (ZYRTEC) 10 MG chewable tablet Chew 10 mg by mouth daily.    [provider]  naproxen sodium (ALEVE) 220 MG tablet Take 440 mg by mouth daily as needed.    [provider]    Allergies    Patient has no known allergies.  Review of Systems   Review of Systems  Constitutional: Negative for chills and fever.  HENT: Negative for congestion, rhinorrhea and sore throat.   Eyes: Negative for visual disturbance.  Respiratory: Negative for cough and  shortness of breath.   Cardiovascular: Negative for chest pain and leg swelling.  Gastrointestinal: Negative for abdominal pain, diarrhea, nausea and vomiting.  Genitourinary: Positive for flank pain and frequency. Negative for dysuria and hematuria.  Musculoskeletal: Negative for back pain and neck pain.  Skin: Negative for rash.  Neurological: Negative for dizziness, light-headedness and headaches.  Hematological: Does not bruise/bleed easily.  Psychiatric/Behavioral: Negative for confusion.    Physical Exam Updated Vital Signs BP 125/75 (BP Location: Right Arm)   Pulse (!) 101   Temp 98.1 F (36.7 C) (Oral)   Resp 18   Ht 1.676 m (5\' 6" )   Wt 117.9 kg   SpO2 98%   BMI 41.97 kg/m   Physical Exam Vitals and nursing note reviewed.  Constitutional:      General: She is not in acute distress.    Appearance: Normal appearance. She is well-developed.  HENT:     Head: Normocephalic and atraumatic.  Eyes:     Extraocular Movements: Extraocular movements intact.     Conjunctiva/sclera: Conjunctivae normal.     Pupils: Pupils are equal, round, and reactive to light.  Cardiovascular:     Rate and Rhythm: Normal rate and regular rhythm.     Heart sounds: No murmur heard.   Pulmonary:  Effort: Pulmonary effort is normal. No respiratory distress.     Breath sounds: Normal breath sounds.  Abdominal:     Palpations: Abdomen is soft.     Tenderness: There is no abdominal tenderness.  Musculoskeletal:        General: Normal range of motion.     Cervical back: Normal range of motion and neck supple.  Skin:    General: Skin is warm and dry.     Capillary Refill: Capillary refill takes less than 2 seconds.  Neurological:     General: No focal deficit present.     Mental Status: She is alert and oriented to person, place, and time.     Cranial Nerves: No cranial nerve deficit.     Sensory: No sensory deficit.     Motor: No weakness.     ED Results / Procedures / Treatments    Labs (all labs ordered are listed, but only abnormal results are displayed) Labs Reviewed  URINALYSIS, ROUTINE W REFLEX MICROSCOPIC - Abnormal; Notable for the following components:      Result Value   APPearance HAZY (*)    Leukocytes,Ua TRACE (*)    Bacteria, UA MANY (*)    All other components within normal limits  URINE CULTURE  CBC  BASIC METABOLIC PANEL    EKG None  Radiology CT Renal Stone Study  Result Date: 04/14/2020 CLINICAL DATA:  Right-sided flank pain. EXAM: CT ABDOMEN AND PELVIS WITHOUT CONTRAST TECHNIQUE: Multidetector CT imaging of the abdomen and pelvis was performed following the standard protocol without IV contrast. COMPARISON:  March 26, 2019 FINDINGS: Lower chest: The lung bases are clear. The heart size is normal. Hepatobiliary: The liver is normal. Normal gallbladder.There is no biliary ductal dilation. Pancreas: Normal contours without ductal dilatation. No peripancreatic fluid collection. Spleen: Unremarkable. Adrenals/Urinary Tract: --Adrenal glands: There is a stable left adrenal adenoma. --Right kidney/ureter: No hydronephrosis or radiopaque kidney stones. --Left kidney/ureter: No hydronephrosis or radiopaque kidney stones. --Urinary bladder: Unremarkable. Stomach/Bowel: --Stomach/Duodenum: No hiatal hernia or other gastric abnormality. Normal duodenal course and caliber. --Small bowel: Unremarkable. --Colon: Unremarkable. --Appendix: Normal. Vascular/Lymphatic: Normal course and caliber of the major abdominal vessels. --No retroperitoneal lymphadenopathy. --No mesenteric lymphadenopathy. --No pelvic or inguinal lymphadenopathy. Reproductive: Unremarkable Other: No ascites or free air. There is a fat containing umbilical hernia. Musculoskeletal. No acute displaced fractures. IMPRESSION: No acute abnormality. No findings to explain the patient's pain. There are no radiopaque kidney stones. Electronically Signed   By: Katherine Mantle M.D.   On: 04/14/2020 16:21     Procedures Procedures (including critical care time)  Medications Ordered in ED Medications  sodium chloride 0.9 % bolus 1,000 mL (has no administration in time range)  cefTRIAXone (ROCEPHIN) 1 g in sodium chloride 0.9 % 100 mL IVPB (has no administration in time range)    ED Course  I have reviewed the triage vital signs and the nursing notes.  Pertinent labs & imaging results that were available during my care of the patient were reviewed by me and considered in my medical decision making (see chart for details).    MDM Rules/Calculators/A&P                          Patient nontoxic no acute distress.  However does have sort of right CVA pain no signs of any kidney stones.  Urinalysis suggestive of urinary tract infection.  Will call this pyelonephritis and treated as such.  Probably early pyelonephritis.  Patient a little tachycardic not febrile renal functions normal.  No leukocytosis.  Patient will receive 1 g Rocephin and then continue on Keflex for 7 days.  Would expect significant improvement over the next 24 hours.  Patient will return for any new or worse symptoms.  Final Clinical Impression(s) / ED Diagnoses Final diagnoses:  Pyelonephritis    Rx / DC Orders ED Discharge Orders    None       Vanetta Mulders, MD 04/14/20 1718

## 2020-04-14 NOTE — ED Triage Notes (Signed)
Pt c/o right sided back pain, reports hx of 6 kidney stones, pt reports "feels like a kidney stone is stuck in the kidney"

## 2020-04-15 ENCOUNTER — Encounter (INDEPENDENT_AMBULATORY_CARE_PROVIDER_SITE_OTHER): Payer: Self-pay | Admitting: Bariatrics

## 2020-04-15 ENCOUNTER — Ambulatory Visit (INDEPENDENT_AMBULATORY_CARE_PROVIDER_SITE_OTHER): Payer: 59 | Admitting: Bariatrics

## 2020-04-15 VITALS — BP 110/77 | Temp 97.9°F | Ht 65.0 in | Wt 270.0 lb

## 2020-04-15 DIAGNOSIS — Z1331 Encounter for screening for depression: Secondary | ICD-10-CM | POA: Diagnosis not present

## 2020-04-15 DIAGNOSIS — R7309 Other abnormal glucose: Secondary | ICD-10-CM

## 2020-04-15 DIAGNOSIS — Z9189 Other specified personal risk factors, not elsewhere classified: Secondary | ICD-10-CM

## 2020-04-15 DIAGNOSIS — M25561 Pain in right knee: Secondary | ICD-10-CM

## 2020-04-15 DIAGNOSIS — E785 Hyperlipidemia, unspecified: Secondary | ICD-10-CM

## 2020-04-15 DIAGNOSIS — M25562 Pain in left knee: Secondary | ICD-10-CM

## 2020-04-15 DIAGNOSIS — R0602 Shortness of breath: Secondary | ICD-10-CM | POA: Diagnosis not present

## 2020-04-15 DIAGNOSIS — N62 Hypertrophy of breast: Secondary | ICD-10-CM

## 2020-04-15 DIAGNOSIS — N200 Calculus of kidney: Secondary | ICD-10-CM | POA: Insufficient documentation

## 2020-04-15 DIAGNOSIS — R5383 Other fatigue: Secondary | ICD-10-CM

## 2020-04-15 DIAGNOSIS — Z0289 Encounter for other administrative examinations: Secondary | ICD-10-CM

## 2020-04-15 DIAGNOSIS — E7849 Other hyperlipidemia: Secondary | ICD-10-CM | POA: Diagnosis not present

## 2020-04-15 DIAGNOSIS — Z6841 Body Mass Index (BMI) 40.0 and over, adult: Secondary | ICD-10-CM

## 2020-04-15 DIAGNOSIS — E1169 Type 2 diabetes mellitus with other specified complication: Secondary | ICD-10-CM

## 2020-04-15 LAB — URINE CULTURE: Culture: NO GROWTH

## 2020-04-15 MED FILL — Hydrocodone-Acetaminophen Tab 5-325 MG: ORAL | Qty: 6 | Status: AC

## 2020-04-15 NOTE — Progress Notes (Signed)
Dear Dr. Foster Simpson,   Thank you for referring Tiffany Young. Young to our clinic. The following note includes my evaluation and treatment recommendations.  Chief Complaint:   OBESITY Tiffany Young (MR# [id number]) is a 49 y.o. female who presents for evaluation and treatment of obesity and related comorbidities. Current BMI is Body mass index is 44.93 kg/m.Tiffany Young has been struggling with her weight for many years and has been unsuccessful in either losing weight, maintaining weight loss, or reaching her healthy weight goal.  Tiffany Young is currently in the action stage of change and ready to dedicate time achieving and maintaining a healthier weight. Tiffany Young is interested in becoming our patient and working on intensive lifestyle modifications including (but not limited to) diet and exercise for weight loss.  Tiffany Young does like to cook, but notes time as an obstacle. She loves sweets and considers herself to be a picky eater. She skips meals and has nighttime eating.  Tiffany Young's habits were reviewed today and are as follows: Her family eats meals together, she thinks her family will eat healthier with her, her desired weight loss is 96 lbs, she has been heavy since 49 years of age, she started gaining weight while having marital problems in 2000, her heaviest weight ever was 295 pounds, she is a picky eater and doesn't like to eat healthier foods, she craves sweets, she snacks frequently in the evenings, she sometimes makes poor food choices, she has binge eating behaviors and she struggles with emotional eating.  Depression Screen Tiffany Young Food and Mood (modified PHQ-9) score was 6.  Depression screen [name] 2/9 04/15/2020  Decreased Interest 0  Down, Depressed, Hopeless 1  PHQ - 2 Score 1  Altered sleeping 1  Tired, decreased energy 1  Change in appetite 1  Feeling bad or failure about yourself  1  Trouble concentrating 1  Moving slowly or fidgety/restless 0  Suicidal  thoughts 0  PHQ-9 Score 6  Difficult doing work/chores Not difficult at all   Subjective:   Other fatigue. Tiffany Young denies daytime somnolence and denies waking up still tired. Tiffany Young generally gets 4-6 hours of sleep per night, and states that she does not sleep well most nights. Snoring is present. Apneic episodes are not present. Epworth Sleepiness Score is 2.  SOB (shortness of breath) on exertion. [name] notes increasing shortness of breath with certain activities and seems to be worsening over time with weight gain. She notes getting out of breath sooner with activity than she used to. This has gotten worse recently. Tiffany Young denies shortness of breath at rest or orthopnea.  Other hyperlipidemia. [name] is taking atorvastatin.  No results found for: CHOL, HDL, LDLCALC, LDLDIRECT, TRIG, CHOLHDL Lab Results  Component Value Date   ALT 15 03/26/2019   AST 18 03/26/2019   ALKPHOS 41 03/26/2019   BILITOT 0.6 03/26/2019   The ASCVD Risk score Denman George DC Jr., et al., 2013) failed to calculate for the following reasons:   Cannot find a previous HDL lab   Cannot find a previous total cholesterol lab  Pain in both knees, unspecified chronicity. Tiffany Young notes pain in both knees, left greater than right.  Symptomatic mammary hypertrophy. Tiffany Young had breast reduction scheduled for 05/09/2020.  Elevated glucose. Glucose level 104.  Depression screening. Tiffany Young had a mildly positive depression screen with a PHQ-9 score of 6.  At risk for activity intolerance. Tiffany Young is at risk of exercise intolerance due to obesity, knee and back pain.  Assessment/Plan:   Other  fatigue. [name] does not feel that her weight is causing her energy to be lower than it should be. Fatigue may be related to obesity, depression or many other causes. Labs will be ordered, and in the meanwhile, Tiffany Young will focus on self care including making healthy food choices, increasing physical activity and focusing on stress  reduction. EKG 12-Lead, VITAMIN D 25 Hydroxy (Vit-D Deficiency, Fractures), T4, free, TSH, T3 testing ordered today.  SOB (shortness of breath) on exertion. Tiffany Young does feel that she gets out of breath more easily that she used to when she exercises. Tiffany Young's shortness of breath appears to be obesity related and exercise induced. She has agreed to work on weight loss and gradually increase exercise to treat her exercise induced shortness of breath. Will continue to monitor closely.  Other hyperlipidemia. Cardiovascular risk and specific lipid/LDL goals reviewed.  We discussed several lifestyle modifications today and Tiffany Young will continue to work on diet, exercise and weight loss efforts. Orders and follow up as documented in patient record. She will continue her medication as directed and will decrease saturated fats in her diet.  Counseling Intensive lifestyle modifications are the first line treatment for this issue. . Dietary changes: Increase soluble fiber. Decrease simple carbohydrates. . Exercise changes: Moderate to vigorous-intensity aerobic activity 150 minutes per week if tolerated. . Lipid-lowering medications: see documented in medical record.  Pain in both knees, unspecified chronicity. Tiffany Young will work on weight loss and will gradually increase her activity.  Symptomatic mammary hypertrophy. Tiffany Young will follow-up with her surgeon as scheduled and as directed.  Elevated glucose. Hemoglobin A1c, Insulin, random ordered today.  Depression screening. Tiffany Young had a positive depression screening. Depression is commonly associated with obesity and often results in emotional eating behaviors. We will monitor this closely and work on CBT to help improve the non-hunger eating patterns. Referral to Psychology may be required if no improvement is seen as she continues in our clinic.  At risk for activity intolerance. Tiffany Young was given approximately 15 minutes of exercise intolerance  counseling today. She is 49 y.o. female and has risk factors exercise intolerance including obesity. We discussed intensive lifestyle modifications today with an emphasis on specific weight loss instructions and strategies. Tiffany Young will slowly increase activity as tolerated.  Repetitive spaced learning was employed today to elicit superior memory formation and behavioral change.  Class 3 severe obesity with serious comorbidity and body mass index (BMI) of 45.0 to 49.9 in adult, unspecified obesity type (HCC).  Tiffany Young is currently in the action stage of change and her goal is to continue with weight loss efforts. I recommend Sun begin the structured treatment plan as follows:  She has agreed to the Category 2 Plan.  She will work on meal planning.  We reviewed with the patient labs from 03/26/2019 including CMP, CBC, and glucose.  Handout was provided on Smart Fruit Choices.  Exercise goals: All adults should avoid inactivity. Some physical activity is better than none, and adults who participate in any amount of physical activity gain some health benefits.   Behavioral modification strategies: increasing lean protein intake, decreasing simple carbohydrates, increasing vegetables, increasing water intake, decreasing eating out, no skipping meals, meal planning and cooking strategies, keeping healthy foods in the home and planning for success.  She was informed of the importance of frequent follow-up visits to maximize her success with intensive lifestyle modifications for her multiple health conditions. She was informed we would discuss her lab results at her next visit unless there is a  critical issue that needs to be addressed sooner. Tiffany Young agreed to keep her next visit at the agreed upon time to discuss these results.  Objective:   Blood pressure 110/77, temperature 97.9 F (36.6 C), height 5\' 5"  (1.651 m), weight 270 lb (122.5 kg), SpO2 99 %. Body mass index is 44.93 kg/m.  EKG:  Sinus  Rhythm with a rate of 80 BPM. Low voltage in limb leads. Left axis. Poor R-wave progression - may be secondary to pulmonary disease. Consider old anterior infarct. Abnormal.  Indirect Calorimeter completed today shows a VO2 of 239 and a REE of 1662.  Her calculated basal metabolic rate is thus her basal metabolic rate is worse than expected.  General: Cooperative, alert, well developed, in no acute distress. HEENT: Conjunctivae and lids unremarkable. Cardiovascular: Regular rhythm.  Lungs: Normal work of breathing. Neurologic: No focal deficits.   Lab Results  Component Value Date   CREATININE 0.74 04/14/2020   BUN 15 04/14/2020   NA 140 04/14/2020   K 4.1 04/14/2020   CL 104 04/14/2020   CO2 29 04/14/2020   Lab Results  Component Value Date   ALT 15 03/26/2019   AST 18 03/26/2019   ALKPHOS 41 03/26/2019   BILITOT 0.6 03/26/2019   No results found for: HGBA1C No results found for: INSULIN No results found for: TSH No results found for: CHOL, HDL, LDLCALC, LDLDIRECT, TRIG, CHOLHDL Lab Results  Component Value Date   WBC 5.8 04/14/2020   HGB 13.8 04/14/2020   HCT 42.3 04/14/2020   MCV 95.9 04/14/2020   PLT 252 04/14/2020   No results found for: IRON, TIBC, FERRITIN  Attestation Statements:   Reviewed by clinician on day of visit: allergies, medications, problem list, medical history, surgical history, family history, social history, and previous encounter notes.  06/15/2020, am acting as Fernanda Drum for Energy manager, DO   I have reviewed the above documentation for accuracy and completeness, and I agree with the above. [employer], DO

## 2020-04-16 LAB — TSH: TSH: 1.54 u[IU]/mL (ref 0.450–4.500)

## 2020-04-16 LAB — VITAMIN D 25 HYDROXY (VIT D DEFICIENCY, FRACTURES): Vit D, 25-Hydroxy: 34.8 ng/mL (ref 30.0–100.0)

## 2020-04-16 LAB — INSULIN, RANDOM: INSULIN: 17.3 u[IU]/mL (ref 2.6–24.9)

## 2020-04-16 LAB — T4, FREE: Free T4: 1.08 ng/dL (ref 0.82–1.77)

## 2020-04-16 LAB — HEMOGLOBIN A1C
Est. average glucose Bld gHb Est-mCnc: 117 mg/dL
Hgb A1c MFr Bld: 5.7 % — ABNORMAL HIGH (ref 4.8–5.6)

## 2020-04-16 LAB — T3: T3, Total: 126 ng/dL (ref 71–180)

## 2020-04-16 NOTE — H&P (View-Only) (Signed)
   Patient ID: Tiffany Young, female    DOB: 07/10/1971, 48 y.o.   MRN: [id number]  Chief Complaint  Patient presents with  . Pre-op Exam      ICD-10-CM   1. Neck pain  M54.2   2. Chronic bilateral thoracic back pain  M54.6    G89.29   3. Symptomatic mammary hypertrophy  N62      History of Present Illness: Tiffany Young is a 48 y.o.  female  with a history of symptomatic mammary hypertrophy.  She presents for preoperative evaluation for upcoming procedure, bilateral breast reduction with liposuction, scheduled for 05/09/2020 with Dr. Dillingham  The patient has not had problems with anesthesia. No history of DVT/PE.  No family history of DVT/PE.  No family or personal history of bleeding or clotting disorders.  Patient is not currently taking any blood thinners.  No history of CVA/MI.   Summary of Previous Visit: Patient denies any tobacco use, she is prediabetic, she is a nurse.  Breasts are extremely large and fairly symmetric.  STN on the right is 43 cm and STN on the left is 44 cm.  IMF distance is 22 cm.  Patient is 5 feet 6 inches tall and weighs 269 pounds.  Preop bra size is 42 F cup.  Estimated excess breast tissue removed the time of surgery is 1200 g on the left and 1200 g on the right.  Patient would like to be a C/D cup and no larger. She is planning to lose additional weight and questions if that will fluctuate her breast size. I informed her it's possible weight change could effect her size, but it is no guaranteed.  Patient had a mammogram in April 2021 which required further work-up/ultrasound which showed no evidence of malignancy.  Recommended additional mammogram in 1 year.  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  PMH Significant for: Hyperlipidemia, prediabetes (most recent A1c 5.7, 04/15/2020).  Most recently had some pyelonephritis, went to ED and was provided abx and norco for pain. Doing better.  Past Medical History: Allergies: No Known  Allergies  Current Medications:  Current Outpatient Medications:  .  atorvastatin (LIPITOR) 20 MG tablet, Take 20 mg by mouth daily., Disp: , Rfl:  .  cephALEXin (KEFLEX) 500 MG capsule, Take 500 mg by mouth 4 (four) times daily., Disp: , Rfl:  .  cephALEXin (KEFLEX) 500 MG capsule, Take 1 capsule (500 mg total) by mouth 4 (four) times daily for 3 days., Disp: 12 capsule, Rfl: 0 .  HYDROcodone-acetaminophen (NORCO) 5-325 MG tablet, Take 1 tablet by mouth every 6 (six) hours as needed for up to 5 days for severe pain., Disp: 20 tablet, Rfl: 0 .  HYDROcodone-acetaminophen (NORCO/VICODIN) 5-325 MG tablet, Take 1 tablet by mouth every 6 (six) hours as needed for moderate pain. (Patient not taking: Reported on 04/17/2020), Disp: , Rfl:  .  ondansetron (ZOFRAN) 4 MG tablet, Take 1 tablet (4 mg total) by mouth every 8 (eight) hours as needed for nausea or vomiting., Disp: 20 tablet, Rfl: 0  Past Medical Problems: Past Medical History:  Diagnosis Date  . Back pain   . Carpal tunnel syndrome   . GERD (gastroesophageal reflux disease)   . High cholesterol   . Joint pain   . Knee pain, bilateral   . Pre-diabetes   . Renal disorder    kidney stones   . Stomach ulcer   . Symptomatic mammary hypertrophy 03/01/2020    Past Surgical History:   Past Surgical History:  Procedure Laterality Date  . KNEE SURGERY    . left heel spur      Social History: Social History   Socioeconomic History  . Marital status: Single    Spouse name: Not on file  . Number of children: Not on file  . Years of education: Not on file  . Highest education level: Not on file  Occupational History  . Occupation: [employer]  Tobacco Use  . Smoking status: Never Smoker  . Smokeless tobacco: Never Used  Vaping Use  . Vaping Use: Never used  Substance and Sexual Activity  . Alcohol use: No  . Drug use: Not Currently  . Sexual activity: Not on file  Other Topics Concern  . Not on file    Social History Narrative  . Not on file   Social Determinants of Health   Financial Resource Strain:   . Difficulty of Paying Living Expenses:   Food Insecurity:   . Worried About Programme researcher, broadcasting/film/video in the Last Year:   . Barista in the Last Year:   Transportation Needs:   . Freight forwarder (Medical):   Marland Kitchen Lack of Transportation (Non-Medical):   Physical Activity:   . Days of Exercise per Week:   . Minutes of Exercise per Session:   Stress:   . Feeling of Stress :   Social Connections:   . Frequency of Communication with Friends and Family:   . Frequency of Social Gatherings with Friends and Family:   . Attends Religious Services:   . Active Member of Clubs or Organizations:   . Attends Banker Meetings:   Marland Kitchen Marital Status:   Intimate Partner Violence:   . Fear of Current or Ex-Partner:   . Emotionally Abused:   Marland Kitchen Physically Abused:   . Sexually Abused:     Family History: Family History  Problem Relation Age of Onset  . Breast cancer Paternal Aunt   . Breast cancer Cousin   . Diabetes Mother   . Stroke Mother   . Kidney disease Mother   . Cancer Mother   . AAA (abdominal aortic aneurysm) Father     Review of Systems: Review of Systems  Constitutional: Negative.   Respiratory: Negative.   Cardiovascular: Negative.   Gastrointestinal: Negative.   Musculoskeletal: Positive for back pain, myalgias and neck pain.    Physical Exam: Vital Signs BP 121/84 (BP Location: Left Arm, Patient Position: Sitting, Cuff Size: Large)   Pulse (!) 110   Temp 98.6 F (37 C) (Oral)   Ht 5\' 5"  (1.651 m)   Wt 275 lb 9.6 oz (125 kg)   SpO2 98%   BMI 45.86 kg/m  Physical Exam Exam conducted with a chaperone present.  Constitutional:      General: She is not in acute distress.    Appearance: Normal appearance. She is not ill-appearing.  HENT:     Head: Normocephalic and atraumatic.  Eyes:     Pupils: Pupils are equal, round Neck:      Musculoskeletal: Normal range of motion.  Cardiovascular:     Rate and Rhythm: Normal rate and regular rhythm.     Pulses: Normal pulses.     Heart sounds: Normal heart sounds. No murmur.  Pulmonary:     Effort: Pulmonary effort is normal. No respiratory distress.     Breath sounds: Normal breath sounds. No wheezing.  Abdominal:     General: Abdomen is  flat. There is no distension.     Palpations: Abdomen is soft.     Tenderness: There is no abdominal tenderness.  Musculoskeletal: Normal range of motion.  Skin:    General: Skin is warm and dry.     Findings: No erythema or rash.  Neurological:     General: No focal deficit present.     Mental Status: She is alert and oriented to person, place, and time. Mental status is at baseline.     Motor: No weakness.  Psychiatric:        Mood and Affect: Mood normal.        Behavior: Behavior normal.    Assessment/Plan: Ms. Schuur scheduled for bilateral breast reduction with liposuction with Dr. Ulice Bold.  Risks, benefits, and alternatives of procedure discussed, questions answered and consent obtained.    Smoking Status: non smoker; Counseling Given? N/A Last Mammogram: April 2021, plus axillary ultrasound (left) ; Results: No evidence of malignancy  Caprini Score: 5; Risk Factors include: age, BMI > 25, varicose veins, and length of planned surgery. Recommendation for mechanical and pharmacological prophylaxis during surgery. Encourage early ambulation.   Pictures obtained: 03/01/2020  Post-op Rx sent to pharmacy: norco, zofran, keflex  PCP clearance sent to [facility] PA-C at [facility] physicians. Patient reports a history of palpitations, but reports this has resolved and she had a normal EKG. She is currently part of a weight loss program and has no CP/SOB with exercise.   Patient was provided with the breast reduction and General Surgical Risk consent document and Pain Medication Agreement prior to their appointment.  They had  adequate time to read through the risk consent documents and Pain Medication Agreement. We also discussed them in person together during this preop appointment. All of their questions were answered to their satisfaction.  Recommended calling if they have any further questions.  Risk consent form and Pain Medication Agreement to be scanned into patient's chart.  The risk that can be encountered with breast reduction were discussed and include the following but not limited to these:  Breast asymmetry, fluid accumulation, firmness of the breast, inability to breast feed, loss of nipple or areola, skin loss, decrease or no nipple sensation, fat necrosis of the breast tissue, bleeding, infection, healing delay.  There are risks of anesthesia, changes to skin sensation and injury to nerves or blood vessels.  The muscle can be temporarily or permanently injured.  You may have an allergic reaction to tape, suture, glue, blood products which can result in skin discoloration, swelling, pain, skin lesions, poor healing.  Any of these can lead to the need for revisonal surgery or stage procedures.  A reduction has potential to interfere with diagnostic procedures.  Nipple or breast piercing can increase risks of infection.  This procedure is best done when the breast is fully developed.  Changes in the breast will continue to occur over time.  Pregnancy can alter the outcomes of previous breast reduction surgery, weight gain and weigh loss can also effect the long term appearance.      Electronically signed by: Kermit Balo Kaleeah Gingerich, PA-C 04/17/2020 2:08 PM

## 2020-04-16 NOTE — Progress Notes (Addendum)
Patient ID: Tiffany Young, female    DOB: [birth year], 49 y.o.   MRN: [id number]  Chief Complaint  Patient presents with  . Pre-op Exam      ICD-10-CM   1. Neck pain  M54.2   2. Chronic bilateral thoracic back pain  M54.6    G89.29   3. Symptomatic mammary hypertrophy  N62      History of Present Illness: Tiffany Young is a 49 y.o.  female  with a history of symptomatic mammary hypertrophy.  She presents for preoperative evaluation for upcoming procedure, bilateral breast reduction with liposuction, scheduled for 05/09/2020 with Dr. Ulice Bold  The patient has not had problems with anesthesia. No history of DVT/PE.  No family history of DVT/PE.  No family or personal history of bleeding or clotting disorders.  Patient is not currently taking any blood thinners.  No history of CVA/MI.   Summary of Previous Visit: Patient denies any tobacco use, she is prediabetic, she is a Engineer, civil (consulting).  Breasts are extremely large and fairly symmetric.  STN on the right is 43 cm and STN on the left is 44 cm.  IMF distance is 22 cm.  Patient is 5 feet 6 inches tall and weighs 269 pounds.  Preop bra size is 42 F cup.  Estimated excess breast tissue removed the time of surgery is 1200 g on the left and 1200 g on the right.  Patient would like to be a C/D cup and no larger. She is planning to lose additional weight and questions if that will fluctuate her breast size. I informed her it's possible weight change could effect her size, but it is no guaranteed.  Patient had a mammogram in April 2021 which required further work-up/ultrasound which showed no evidence of malignancy.  Recommended additional mammogram in 1 year.  Job: Engineer, civil (consulting) for [employer].  PMH Significant for: Hyperlipidemia, prediabetes (most recent A1c 5.7, 04/15/2020).  Most recently had some pyelonephritis, went to ED and was provided abx and norco for pain. Doing better.  Past Medical History: Allergies: No Known  Allergies  Current Medications:  Current Outpatient Medications:  .  atorvastatin (LIPITOR) 20 MG tablet, Take 20 mg by mouth daily., Disp: , Rfl:  .  cephALEXin (KEFLEX) 500 MG capsule, Take 500 mg by mouth 4 (four) times daily., Disp: , Rfl:  .  cephALEXin (KEFLEX) 500 MG capsule, Take 1 capsule (500 mg total) by mouth 4 (four) times daily for 3 days., Disp: 12 capsule, Rfl: 0 .  HYDROcodone-acetaminophen (NORCO) 5-325 MG tablet, Take 1 tablet by mouth every 6 (six) hours as needed for up to 5 days for severe pain., Disp: 20 tablet, Rfl: 0 .  HYDROcodone-acetaminophen (NORCO/VICODIN) 5-325 MG tablet, Take 1 tablet by mouth every 6 (six) hours as needed for moderate pain. (Patient not taking: Reported on 04/17/2020), Disp: , Rfl:  .  ondansetron (ZOFRAN) 4 MG tablet, Take 1 tablet (4 mg total) by mouth every 8 (eight) hours as needed for nausea or vomiting., Disp: 20 tablet, Rfl: 0  Past Medical Problems: Past Medical History:  Diagnosis Date  . Back pain   . Carpal tunnel syndrome   . GERD (gastroesophageal reflux disease)   . High cholesterol   . Joint pain   . Knee pain, bilateral   . Pre-diabetes   . Renal disorder    kidney stones   . Stomach ulcer   . Symptomatic mammary hypertrophy 03/01/2020    Past Surgical History:  Past Surgical History:  Procedure Laterality Date  . KNEE SURGERY    . left heel spur      Social History: Social History   Socioeconomic History  . Marital status: Single    Spouse name: Not on file  . Number of children: Not on file  . Years of education: Not on file  . Highest education level: Not on file  Occupational History  . Occupation: [employer]  Tobacco Use  . Smoking status: Never Smoker  . Smokeless tobacco: Never Used  Vaping Use  . Vaping Use: Never used  Substance and Sexual Activity  . Alcohol use: No  . Drug use: Not Currently  . Sexual activity: Not on file  Other Topics Concern  . Not on file    Social History Narrative  . Not on file   Social Determinants of Health   Financial Resource Strain:   . Difficulty of Paying Living Expenses:   Food Insecurity:   . Worried About Programme researcher, broadcasting/film/video in the Last Year:   . Barista in the Last Year:   Transportation Needs:   . Freight forwarder (Medical):   Marland Kitchen Lack of Transportation (Non-Medical):   Physical Activity:   . Days of Exercise per Week:   . Minutes of Exercise per Session:   Stress:   . Feeling of Stress :   Social Connections:   . Frequency of Communication with Friends and Family:   . Frequency of Social Gatherings with Friends and Family:   . Attends Religious Services:   . Active Member of Clubs or Organizations:   . Attends Banker Meetings:   Marland Kitchen Marital Status:   Intimate Partner Violence:   . Fear of Current or Ex-Partner:   . Emotionally Abused:   Marland Kitchen Physically Abused:   . Sexually Abused:     Family History: Family History  Problem Relation Age of Onset  . Breast cancer Paternal Aunt   . Breast cancer Cousin   . Diabetes Mother   . Stroke Mother   . Kidney disease Mother   . Cancer Mother   . AAA (abdominal aortic aneurysm) Father     Review of Systems: Review of Systems  Constitutional: Negative.   Respiratory: Negative.   Cardiovascular: Negative.   Gastrointestinal: Negative.   Musculoskeletal: Positive for back pain, myalgias and neck pain.    Physical Exam: Vital Signs BP 121/84 (BP Location: Left Arm, Patient Position: Sitting, Cuff Size: Large)   Pulse (!) 110   Temp 98.6 F (37 C) (Oral)   Ht 5\' 5"  (1.651 m)   Wt 275 lb 9.6 oz (125 kg)   SpO2 98%   BMI 45.86 kg/m  Physical Exam Exam conducted with a chaperone present.  Constitutional:      General: She is not in acute distress.    Appearance: Normal appearance. She is not ill-appearing.  HENT:     Head: Normocephalic and atraumatic.  Eyes:     Pupils: Pupils are equal, round Neck:      Musculoskeletal: Normal range of motion.  Cardiovascular:     Rate and Rhythm: Normal rate and regular rhythm.     Pulses: Normal pulses.     Heart sounds: Normal heart sounds. No murmur.  Pulmonary:     Effort: Pulmonary effort is normal. No respiratory distress.     Breath sounds: Normal breath sounds. No wheezing.  Abdominal:     General: Abdomen is  flat. There is no distension.     Palpations: Abdomen is soft.     Tenderness: There is no abdominal tenderness.  Musculoskeletal: Normal range of motion.  Skin:    General: Skin is warm and dry.     Findings: No erythema or rash.  Neurological:     General: No focal deficit present.     Mental Status: She is alert and oriented to person, place, and time. Mental status is at baseline.     Motor: No weakness.  Psychiatric:        Mood and Affect: Mood normal.        Behavior: Behavior normal.    Assessment/Plan: Tiffany Young scheduled for bilateral breast reduction with liposuction with Dr. Ulice Bold.  Risks, benefits, and alternatives of procedure discussed, questions answered and consent obtained.    Smoking Status: non smoker; Counseling Given? N/A Last Mammogram: April 2021, plus axillary ultrasound (left) ; Results: No evidence of malignancy  Caprini Score: 5; Risk Factors include: age, BMI > 25, varicose veins, and length of planned surgery. Recommendation for mechanical and pharmacological prophylaxis during surgery. Encourage early ambulation.   Pictures obtained: 03/01/2020  Post-op Rx sent to pharmacy: norco, zofran, keflex  PCP clearance sent to [facility] PA-C at [facility] physicians. Patient reports a history of palpitations, but reports this has resolved and she had a normal EKG. She is currently part of a weight loss program and has no CP/SOB with exercise.   Patient was provided with the breast reduction and General Surgical Risk consent document and Pain Medication Agreement prior to their appointment.  They had  adequate time to read through the risk consent documents and Pain Medication Agreement. We also discussed them in person together during this preop appointment. All of their questions were answered to their satisfaction.  Recommended calling if they have any further questions.  Risk consent form and Pain Medication Agreement to be scanned into patient's chart.  The risk that can be encountered with breast reduction were discussed and include the following but not limited to these:  Breast asymmetry, fluid accumulation, firmness of the breast, inability to breast feed, loss of nipple or areola, skin loss, decrease or no nipple sensation, fat necrosis of the breast tissue, bleeding, infection, healing delay.  There are risks of anesthesia, changes to skin sensation and injury to nerves or blood vessels.  The muscle can be temporarily or permanently injured.  You may have an allergic reaction to tape, suture, glue, blood products which can result in skin discoloration, swelling, pain, skin lesions, poor healing.  Any of these can lead to the need for revisonal surgery or stage procedures.  A reduction has potential to interfere with diagnostic procedures.  Nipple or breast piercing can increase risks of infection.  This procedure is best done when the breast is fully developed.  Changes in the breast will continue to occur over time.  Pregnancy can alter the outcomes of previous breast reduction surgery, weight gain and weigh loss can also effect the long term appearance.      Electronically signed by: Kermit Balo Johne Buckle, PA-C 04/17/2020 2:08 PM

## 2020-04-17 ENCOUNTER — Other Ambulatory Visit: Payer: Self-pay

## 2020-04-17 ENCOUNTER — Encounter: Payer: Self-pay | Admitting: Surgical

## 2020-04-17 ENCOUNTER — Ambulatory Visit (INDEPENDENT_AMBULATORY_CARE_PROVIDER_SITE_OTHER): Payer: 59 | Admitting: Surgical

## 2020-04-17 VITALS — BP 121/84 | HR 110 | Temp 98.6°F | Ht 65.0 in | Wt 275.6 lb

## 2020-04-17 DIAGNOSIS — G8929 Other chronic pain: Secondary | ICD-10-CM

## 2020-04-17 DIAGNOSIS — N62 Hypertrophy of breast: Secondary | ICD-10-CM

## 2020-04-17 DIAGNOSIS — M542 Cervicalgia: Secondary | ICD-10-CM

## 2020-04-17 DIAGNOSIS — M546 Pain in thoracic spine: Secondary | ICD-10-CM

## 2020-04-17 MED ORDER — ONDANSETRON HCL 4 MG PO TABS: 4.0000 mg | ORAL_TABLET | Freq: Three times a day (TID) | ORAL | 0 refills | Status: DC | PRN

## 2020-04-17 MED ORDER — HYDROCODONE-ACETAMINOPHEN 5-325 MG PO TABS: 1.0000 | ORAL_TABLET | Freq: Four times a day (QID) | ORAL | 0 refills | Status: AC | PRN

## 2020-04-17 MED ORDER — FLUCONAZOLE 150 MG PO TABS: 150.0000 mg | ORAL_TABLET | Freq: Once | ORAL | 0 refills | Status: AC

## 2020-04-17 MED ORDER — CEPHALEXIN 500 MG PO CAPS
500.0000 mg | ORAL_CAPSULE | Freq: Four times a day (QID) | ORAL | 0 refills | Status: AC
Start: 2020-04-17 — End: 2020-04-20

## 2020-04-25 ENCOUNTER — Encounter (INDEPENDENT_AMBULATORY_CARE_PROVIDER_SITE_OTHER): Payer: Self-pay | Admitting: Bariatrics

## 2020-04-29 ENCOUNTER — Encounter (INDEPENDENT_AMBULATORY_CARE_PROVIDER_SITE_OTHER): Payer: Self-pay | Admitting: Bariatrics

## 2020-04-29 ENCOUNTER — Ambulatory Visit (INDEPENDENT_AMBULATORY_CARE_PROVIDER_SITE_OTHER): Payer: 59 | Admitting: Bariatrics

## 2020-04-29 ENCOUNTER — Other Ambulatory Visit: Payer: Self-pay

## 2020-04-29 VITALS — BP 118/83 | HR 108 | Temp 98.3°F | Ht 65.0 in | Wt 265.0 lb

## 2020-04-29 DIAGNOSIS — E559 Vitamin D deficiency, unspecified: Secondary | ICD-10-CM | POA: Diagnosis not present

## 2020-04-29 DIAGNOSIS — Z9189 Other specified personal risk factors, not elsewhere classified: Secondary | ICD-10-CM | POA: Diagnosis not present

## 2020-04-29 DIAGNOSIS — E7849 Other hyperlipidemia: Secondary | ICD-10-CM | POA: Diagnosis not present

## 2020-04-29 DIAGNOSIS — Z6841 Body Mass Index (BMI) 40.0 and over, adult: Secondary | ICD-10-CM

## 2020-04-29 DIAGNOSIS — R7303 Prediabetes: Secondary | ICD-10-CM

## 2020-04-29 MED ORDER — VITAMIN D (ERGOCALCIFEROL) 1.25 MG (50000 UNIT) PO CAPS: 50000.0000 [IU] | ORAL_CAPSULE | ORAL | 0 refills | Status: DC

## 2020-04-29 NOTE — Progress Notes (Signed)
Chief Complaint:   OBESITY Tiffany Young. Tiffany Young is here to discuss her progress with her obesity treatment plan along with follow-up of her obesity related diagnoses. Augustine is on the Category 2 Plan and states she is following her eating plan approximately 50% of the time. Layali states she is walking 8,000 steps daily 5 times per week.  Today's visit was #: 2 Starting weight: 270 lbs Starting date: 04/15/2020 Today's weight: 265 lbs Today's date: 04/29/2020 Total lbs lost to date: 5 Total lbs lost since last in-office visit: 5  Interim History: Tiffany Young is down 5 lbs and doing well overall. She misses the sweets at night.  Subjective:   Other hyperlipidemia. Baili is taking Lipitor.  No results found for: CHOL, HDL, LDLCALC, LDLDIRECT, TRIG, CHOLHDL Lab Results  Component Value Date   ALT 15 03/26/2019   AST 18 03/26/2019   ALKPHOS 41 03/26/2019   BILITOT 0.6 03/26/2019   The ASCVD Risk score Tiffany George DC Jr., et al., 2013) failed to calculate for the following reasons:   Cannot find a previous HDL lab   Cannot find a previous total cholesterol lab  Pre-diabetes. Tiffany Young has a diagnosis of prediabetes based on her elevated HgA1c and was informed this puts her at greater risk of developing diabetes. She continues to work on diet and exercise to decrease her risk of diabetes. She denies nausea or hypoglycemia. No polyphagia.  Lab Results  Component Value Date   HGBA1C 5.7 (H) 04/15/2020   Lab Results  Component Value Date   INSULIN 17.3 04/15/2020   Vitamin D deficiency. No nausea, vomiting, or muscle weakness.    Ref. Range 04/15/2020 12:40  Vitamin D, 25-Hydroxy Latest Ref Range: 30.0 - 100.0 ng/mL 34.8   At risk for activity intolerance. Tiffany Young is at risk of exercise intolerance due to obesity.  Assessment/Plan:   Other hyperlipidemia. Cardiovascular risk and specific lipid/LDL goals reviewed.  We discussed several lifestyle modifications today and Tiffany Young  will continue to work on diet, exercise and weight loss efforts. Orders and follow up as documented in patient record. She will continue Lipitor as directed.   Counseling Intensive lifestyle modifications are the first line treatment for this issue. . Dietary changes: Increase soluble fiber. Decrease simple carbohydrates. . Exercise changes: Moderate to vigorous-intensity aerobic activity 150 minutes per week if tolerated. . Lipid-lowering medications: see documented in medical record.  Pre-diabetes. Copper will continue to work on weight loss, exercise, increasing healthy fats and protein, and decreasing simple carbohydrates to help decrease the risk of diabetes.   Vitamin D deficiency. Low Vitamin D level contributes to fatigue and are associated with obesity, breast, and colon cancer. She was given a prescription for Vitamin D, Ergocalciferol, (DRISDOL) 1.25 MG (50000 UNIT) CAPS capsule every week #4 with 0 refills and will follow-up for routine testing of Vitamin D, at least 2-3 times per year to avoid over-replacement.   At risk for activity intolerance. Tiffany Young was given approximately 15 minutes of exercise intolerance counseling today. She is 49 y.o. female and has risk factors exercise intolerance including obesity. We discussed intensive lifestyle modifications today with an emphasis on specific weight loss instructions and strategies. Tiffany Young will slowly increase activity as tolerated.  Repetitive spaced learning was employed today to elicit superior memory formation and behavioral change.  Class 3 severe obesity with serious comorbidity and body mass index (BMI) of 40.0 to 44.9 in adult, unspecified obesity type (HCC).  Tiffany Young is currently in the action stage of change.  As such, her goal is to continue with weight loss efforts. She has agreed to the Category 2 Plan.   She will work on meal planning, intentional eating, and increasing her protein intake.   We reviewed with the  patient labs from 04/14/2020 including CMP, CBC, A1c, insulin, thyroid panel, and Vitamin D.  Handout was provided on Eating Out and On The Road.  Exercise goals: All adults should avoid inactivity. Some physical activity is better than none, and adults who participate in any amount of physical activity gain some health benefits.  Behavioral modification strategies: increasing lean protein intake, decreasing simple carbohydrates, increasing vegetables, increasing water intake, decreasing eating out, no skipping meals, meal planning and cooking strategies, keeping healthy foods in the home and planning for success.  Tiffany Young has agreed to follow-up with our clinic in 2-3 weeks. She was informed of the importance of frequent follow-up visits to maximize her success with intensive lifestyle modifications for her multiple health conditions.   Objective:   Blood pressure 118/83, pulse (!) 108, temperature 98.3 F (36.8 C), height 5\' 5"  (1.651 m), weight (!) 265 lb (120.2 kg), SpO2 95 %. Body mass index is 44.1 kg/m.  General: Cooperative, alert, well developed, in no acute distress. HEENT: Conjunctivae and lids unremarkable. Cardiovascular: Regular rhythm.  Lungs: Normal work of breathing. Neurologic: No focal deficits.   Lab Results  Component Value Date   CREATININE 0.74 04/14/2020   BUN 15 04/14/2020   NA 140 04/14/2020   K 4.1 04/14/2020   CL 104 04/14/2020   CO2 29 04/14/2020   Lab Results  Component Value Date   ALT 15 03/26/2019   AST 18 03/26/2019   ALKPHOS 41 03/26/2019   BILITOT 0.6 03/26/2019   Lab Results  Component Value Date   HGBA1C 5.7 (H) 04/15/2020   Lab Results  Component Value Date   INSULIN 17.3 04/15/2020   Lab Results  Component Value Date   TSH 1.540 04/15/2020   No results found for: CHOL, HDL, LDLCALC, LDLDIRECT, TRIG, CHOLHDL Lab Results  Component Value Date   WBC 5.8 04/14/2020   HGB 13.8 04/14/2020   HCT 42.3 04/14/2020   MCV 95.9  04/14/2020   PLT 252 04/14/2020   No results found for: IRON, TIBC, FERRITIN  Attestation Statements:   Reviewed by clinician on day of visit: allergies, medications, problem list, medical history, surgical history, family history, social history, and previous encounter notes.  06/15/2020, am acting as Fernanda Drum for Energy manager, DO   I have reviewed the above documentation for accuracy and completeness, and I agree with the above. [employer], DO

## 2020-05-01 ENCOUNTER — Encounter: Payer: Self-pay | Admitting: Surgical

## 2020-05-01 NOTE — Progress Notes (Signed)
Surgical Clearance has been received from [facility], PA-C for patient's upcoming surgery bilateral breast reduction with liposuction with Dr. Ulice Bold.   Medications to hold prior to surgery: none.

## 2020-05-01 NOTE — H&P (View-Only) (Signed)
Surgical Clearance has been received from [facility]ll[facility], PA-C for patient's upcoming surgery bilateral breast reduction with liposuction with Dr. Dillingham.   Medications to hold prior to surgery: none.   

## 2020-05-02 ENCOUNTER — Encounter (HOSPITAL_BASED_OUTPATIENT_CLINIC_OR_DEPARTMENT_OTHER): Payer: Self-pay | Admitting: Plastic Surgery

## 2020-05-02 ENCOUNTER — Other Ambulatory Visit: Payer: Self-pay

## 2020-05-06 ENCOUNTER — Other Ambulatory Visit (HOSPITAL_COMMUNITY)
Admission: RE | Admit: 2020-05-06 | Discharge: 2020-05-06 | Disposition: A | Discharge status: Home / Self Care | Payer: 59 | Source: Ambulatory Visit | Attending: Plastic Surgery | Admitting: Plastic Surgery

## 2020-05-06 DIAGNOSIS — Z20822 Contact with and (suspected) exposure to covid-19: Secondary | ICD-10-CM | POA: Diagnosis not present

## 2020-05-06 DIAGNOSIS — Z01812 Encounter for preprocedural laboratory examination: Secondary | ICD-10-CM | POA: Diagnosis present

## 2020-05-06 LAB — SARS CORONAVIRUS 2 (TAT 6-24 HRS): SARS Coronavirus 2: NEGATIVE

## 2020-05-08 HISTORY — PX: REDUCTION MAMMAPLASTY: SUR839

## 2020-05-09 ENCOUNTER — Other Ambulatory Visit: Payer: Self-pay | Admitting: Plastic Surgery

## 2020-05-09 ENCOUNTER — Other Ambulatory Visit: Payer: Self-pay

## 2020-05-09 ENCOUNTER — Encounter (HOSPITAL_BASED_OUTPATIENT_CLINIC_OR_DEPARTMENT_OTHER)
Admission: RE | Disposition: A | Discharge status: Home / Self Care | Payer: Self-pay | Source: Home / Self Care | Attending: Plastic Surgery

## 2020-05-09 ENCOUNTER — Ambulatory Visit (HOSPITAL_BASED_OUTPATIENT_CLINIC_OR_DEPARTMENT_OTHER): Payer: 59 | Admitting: Anesthesiology

## 2020-05-09 ENCOUNTER — Ambulatory Visit (HOSPITAL_BASED_OUTPATIENT_CLINIC_OR_DEPARTMENT_OTHER)
Admission: RE | Admit: 2020-05-09 | Discharge: 2020-05-09 | Disposition: A | Discharge status: Home / Self Care | Payer: 59 | Attending: Plastic Surgery | Admitting: Plastic Surgery

## 2020-05-09 ENCOUNTER — Encounter (HOSPITAL_BASED_OUTPATIENT_CLINIC_OR_DEPARTMENT_OTHER): Payer: Self-pay | Admitting: Plastic Surgery

## 2020-05-09 DIAGNOSIS — E7800 Pure hypercholesterolemia, unspecified: Secondary | ICD-10-CM | POA: Diagnosis not present

## 2020-05-09 DIAGNOSIS — N62 Hypertrophy of breast: Secondary | ICD-10-CM | POA: Diagnosis present

## 2020-05-09 DIAGNOSIS — Z79899 Other long term (current) drug therapy: Secondary | ICD-10-CM | POA: Insufficient documentation

## 2020-05-09 DIAGNOSIS — Z6841 Body Mass Index (BMI) 40.0 and over, adult: Secondary | ICD-10-CM | POA: Insufficient documentation

## 2020-05-09 DIAGNOSIS — M546 Pain in thoracic spine: Secondary | ICD-10-CM | POA: Diagnosis not present

## 2020-05-09 DIAGNOSIS — E785 Hyperlipidemia, unspecified: Secondary | ICD-10-CM | POA: Insufficient documentation

## 2020-05-09 DIAGNOSIS — G8929 Other chronic pain: Secondary | ICD-10-CM

## 2020-05-09 DIAGNOSIS — M542 Cervicalgia: Secondary | ICD-10-CM | POA: Diagnosis not present

## 2020-05-09 HISTORY — PX: BREAST REDUCTION SURGERY: SHX8

## 2020-05-09 HISTORY — DX: Other specified postprocedural states: R11.2

## 2020-05-09 HISTORY — DX: Other specified postprocedural states: Z98.890

## 2020-05-09 LAB — POCT PREGNANCY, URINE: Preg Test, Ur: NEGATIVE

## 2020-05-09 SURGERY — BREAST REDUCTION WITH LIPOSUCTION
Anesthesia: General | Site: Breast | Laterality: Bilateral

## 2020-05-09 MED ORDER — OXYCODONE HCL 5 MG PO TABS: 5.0000 mg | ORAL_TABLET | ORAL | Status: DC | PRN

## 2020-05-09 MED ORDER — SCOPOLAMINE 1 MG/3DAYS TD PT72
MEDICATED_PATCH | TRANSDERMAL | Status: DC | PRN
  Administered 2020-05-09: 1 via TRANSDERMAL

## 2020-05-09 MED ORDER — HYDROMORPHONE HCL 1 MG/ML IJ SOLN
INTRAMUSCULAR | Status: AC
  Filled 2020-05-09: qty 0.5

## 2020-05-09 MED ORDER — 0.9 % SODIUM CHLORIDE (POUR BTL) OPTIME
TOPICAL | Status: DC | PRN
  Administered 2020-05-09: 1000 mL

## 2020-05-09 MED ORDER — ONDANSETRON HCL 4 MG/2ML IJ SOLN
4.0000 mg | Freq: Once | INTRAMUSCULAR | Status: AC | PRN
  Administered 2020-05-09: 4 mg via INTRAVENOUS

## 2020-05-09 MED ORDER — NITROGLYCERIN 2 % TD OINT
TOPICAL_OINTMENT | TRANSDERMAL | Status: DC | PRN
  Administered 2020-05-09: 0.5 [in_us] via TOPICAL

## 2020-05-09 MED ORDER — FENTANYL CITRATE (PF) 100 MCG/2ML IJ SOLN
INTRAMUSCULAR | Status: AC
  Filled 2020-05-09: qty 2

## 2020-05-09 MED ORDER — AMISULPRIDE (ANTIEMETIC) 5 MG/2ML IV SOLN
INTRAVENOUS | Status: AC
  Filled 2020-05-09: qty 2

## 2020-05-09 MED ORDER — PROPOFOL 10 MG/ML IV BOLUS
INTRAVENOUS | Status: DC | PRN
  Administered 2020-05-09: 200 mg via INTRAVENOUS

## 2020-05-09 MED ORDER — ACETAMINOPHEN 325 MG PO TABS: 650.0000 mg | ORAL_TABLET | ORAL | Status: DC | PRN

## 2020-05-09 MED ORDER — SCOPOLAMINE 1 MG/3DAYS TD PT72
MEDICATED_PATCH | TRANSDERMAL | Status: AC
  Filled 2020-05-09: qty 1

## 2020-05-09 MED ORDER — LIDOCAINE HCL (PF) 1 % IJ SOLN
INTRAMUSCULAR | Status: AC
  Filled 2020-05-09: qty 30

## 2020-05-09 MED ORDER — ROCURONIUM BROMIDE 10 MG/ML (PF) SYRINGE
PREFILLED_SYRINGE | INTRAVENOUS | Status: AC
  Filled 2020-05-09: qty 10

## 2020-05-09 MED ORDER — CEFAZOLIN SODIUM-DEXTROSE 2-4 GM/100ML-% IV SOLN
INTRAVENOUS | Status: AC
  Filled 2020-05-09: qty 100

## 2020-05-09 MED ORDER — PROPOFOL 500 MG/50ML IV EMUL
INTRAVENOUS | Status: DC | PRN
Start: 2020-05-09 — End: 2020-05-09
  Administered 2020-05-09: 25 ug/kg/min via INTRAVENOUS

## 2020-05-09 MED ORDER — LIDOCAINE-EPINEPHRINE 1 %-1:100000 IJ SOLN
INTRAMUSCULAR | Status: DC | PRN
Start: 2020-05-09 — End: 2020-05-09
  Administered 2020-05-09: 20 mL

## 2020-05-09 MED ORDER — SUGAMMADEX SODIUM 500 MG/5ML IV SOLN
INTRAVENOUS | Status: AC
  Filled 2020-05-09: qty 5

## 2020-05-09 MED ORDER — PHENYLEPHRINE HCL (PRESSORS) 10 MG/ML IV SOLN
INTRAVENOUS | Status: DC | PRN
Start: 2020-05-09 — End: 2020-05-09
  Administered 2020-05-09: 80 ug via INTRAVENOUS
  Administered 2020-05-09: 40 ug via INTRAVENOUS
  Administered 2020-05-09: 80 ug via INTRAVENOUS
  Administered 2020-05-09: 120 ug via INTRAVENOUS
  Administered 2020-05-09: 80 ug via INTRAVENOUS

## 2020-05-09 MED ORDER — SODIUM CHLORIDE 0.9% FLUSH: 3.0000 mL | INTRAVENOUS | Status: DC | PRN

## 2020-05-09 MED ORDER — MIDAZOLAM HCL 5 MG/5ML IJ SOLN
INTRAMUSCULAR | Status: DC | PRN
  Administered 2020-05-09: 2 mg via INTRAVENOUS

## 2020-05-09 MED ORDER — KETOROLAC TROMETHAMINE 30 MG/ML IJ SOLN: 30.0000 mg | Freq: Once | INTRAMUSCULAR | Status: DC | PRN

## 2020-05-09 MED ORDER — CEFAZOLIN SODIUM-DEXTROSE 1-4 GM/50ML-% IV SOLN
INTRAVENOUS | Status: AC
  Filled 2020-05-09: qty 50

## 2020-05-09 MED ORDER — EPINEPHRINE PF 1 MG/ML IJ SOLN
INTRAMUSCULAR | Status: DC | PRN
  Administered 2020-05-09: 1 mg

## 2020-05-09 MED ORDER — ACETAMINOPHEN 160 MG/5ML PO SOLN: 325.0000 mg | ORAL | Status: DC | PRN

## 2020-05-09 MED ORDER — NITROGLYCERIN 2 % TD OINT
TOPICAL_OINTMENT | TRANSDERMAL | Status: AC
  Filled 2020-05-09: qty 30

## 2020-05-09 MED ORDER — SODIUM CHLORIDE 0.9 % IV SOLN: 250.0000 mL | INTRAVENOUS | Status: DC | PRN

## 2020-05-09 MED ORDER — LACTATED RINGERS IV SOLN: INTRAVENOUS | Status: DC

## 2020-05-09 MED ORDER — MORPHINE SULFATE (PF) 4 MG/ML IV SOLN: 2.0000 mg | INTRAVENOUS | Status: DC | PRN

## 2020-05-09 MED ORDER — DEXTROSE 5 % IV SOLN
3.0000 g | INTRAVENOUS | Status: AC
  Administered 2020-05-09: 3 g via INTRAVENOUS
  Filled 2020-05-09 (×2): qty 3000

## 2020-05-09 MED ORDER — LIDOCAINE HCL (CARDIAC) PF 100 MG/5ML IV SOSY
PREFILLED_SYRINGE | INTRAVENOUS | Status: DC | PRN
  Administered 2020-05-09: 100 mg via INTRAVENOUS

## 2020-05-09 MED ORDER — AMISULPRIDE (ANTIEMETIC) 5 MG/2ML IV SOLN
10.0000 mg | Freq: Once | INTRAVENOUS | Status: AC | PRN
  Administered 2020-05-09: 10 mg via INTRAVENOUS

## 2020-05-09 MED ORDER — SUGAMMADEX SODIUM 200 MG/2ML IV SOLN
INTRAVENOUS | Status: DC | PRN
Start: 2020-05-09 — End: 2020-05-09
  Administered 2020-05-09: 200 mg via INTRAVENOUS

## 2020-05-09 MED ORDER — HYDROMORPHONE HCL 1 MG/ML IJ SOLN
0.2500 mg | INTRAMUSCULAR | Status: DC | PRN
  Administered 2020-05-09 (×3): 0.5 mg via INTRAVENOUS

## 2020-05-09 MED ORDER — OXYCODONE HCL 5 MG/5ML PO SOLN: 5.0000 mg | Freq: Once | ORAL | Status: DC | PRN

## 2020-05-09 MED ORDER — SODIUM CHLORIDE 0.9% FLUSH: 3.0000 mL | Freq: Two times a day (BID) | INTRAVENOUS | Status: DC

## 2020-05-09 MED ORDER — PHENYLEPHRINE 40 MCG/ML (10ML) SYRINGE FOR IV PUSH (FOR BLOOD PRESSURE SUPPORT)
PREFILLED_SYRINGE | INTRAVENOUS | Status: AC
  Filled 2020-05-09: qty 10

## 2020-05-09 MED ORDER — MEPERIDINE HCL 25 MG/ML IJ SOLN: 6.2500 mg | INTRAMUSCULAR | Status: DC | PRN

## 2020-05-09 MED ORDER — FENTANYL CITRATE (PF) 100 MCG/2ML IJ SOLN
INTRAMUSCULAR | Status: DC | PRN
  Administered 2020-05-09: 100 ug via INTRAVENOUS
  Administered 2020-05-09 (×4): 50 ug via INTRAVENOUS

## 2020-05-09 MED ORDER — BUPIVACAINE HCL (PF) 0.25 % IJ SOLN
INTRAMUSCULAR | Status: DC | PRN
  Administered 2020-05-09: 30 mL

## 2020-05-09 MED ORDER — CHLORHEXIDINE GLUCONATE CLOTH 2 % EX PADS: 6.0000 | MEDICATED_PAD | Freq: Once | CUTANEOUS | Status: DC

## 2020-05-09 MED ORDER — LACTATED RINGERS IV SOLN
INTRAVENOUS | Status: AC | PRN
  Administered 2020-05-09: 1000 mL

## 2020-05-09 MED ORDER — DEXAMETHASONE SODIUM PHOSPHATE 4 MG/ML IJ SOLN
INTRAMUSCULAR | Status: DC | PRN
  Administered 2020-05-09: 10 mg via INTRAVENOUS

## 2020-05-09 MED ORDER — ACETAMINOPHEN 325 MG RE SUPP: 650.0000 mg | RECTAL | Status: DC | PRN

## 2020-05-09 MED ORDER — LIDOCAINE HCL 1 % IJ SOLN
INTRAMUSCULAR | Status: DC | PRN
  Administered 2020-05-09: 50 mL

## 2020-05-09 MED ORDER — ACETAMINOPHEN 325 MG PO TABS: 325.0000 mg | ORAL_TABLET | ORAL | Status: DC | PRN

## 2020-05-09 MED ORDER — OXYCODONE HCL 5 MG PO TABS: 5.0000 mg | ORAL_TABLET | Freq: Once | ORAL | Status: DC | PRN

## 2020-05-09 MED ORDER — ROCURONIUM BROMIDE 100 MG/10ML IV SOLN
INTRAVENOUS | Status: DC | PRN
  Administered 2020-05-09: 20 mg via INTRAVENOUS
  Administered 2020-05-09: 70 mg via INTRAVENOUS
  Administered 2020-05-09 (×2): 20 mg via INTRAVENOUS
  Administered 2020-05-09: 10 mg via INTRAVENOUS
  Administered 2020-05-09: 20 mg via INTRAVENOUS

## 2020-05-09 SURGICAL SUPPLY — 65 items
ADH SKN CLS APL DERMABOND .7 (GAUZE/BANDAGES/DRESSINGS) ×2
BAG DECANTER FOR FLEXI CONT (MISCELLANEOUS) ×3 IMPLANT
BINDER BREAST XXLRG (GAUZE/BANDAGES/DRESSINGS) IMPLANT
BIOPATCH RED 1 DISK 7.0 (GAUZE/BANDAGES/DRESSINGS) ×4 IMPLANT
BIOPATCH RED 1IN DISK 7.0MM (GAUZE/BANDAGES/DRESSINGS) ×2
BLADE HEX COATED 2.75 (ELECTRODE) ×3 IMPLANT
BLADE KNIFE PERSONA 10 (BLADE) ×6 IMPLANT
BLADE SURG 15 STRL LF DISP TIS (BLADE) IMPLANT
BLADE SURG 15 STRL SS (BLADE)
BNDG GAUZE ELAST 4 BULKY (GAUZE/BANDAGES/DRESSINGS) IMPLANT
CANISTER SUCT 1200ML W/VALVE (MISCELLANEOUS) ×3 IMPLANT
COVER BACK TABLE 60X90IN (DRAPES) ×3 IMPLANT
COVER MAYO STAND STRL (DRAPES) ×3 IMPLANT
COVER WAND RF STERILE (DRAPES) IMPLANT
DECANTER SPIKE VIAL GLASS SM (MISCELLANEOUS) ×3 IMPLANT
DERMABOND ADVANCED (GAUZE/BANDAGES/DRESSINGS) ×4
DERMABOND ADVANCED .7 DNX12 (GAUZE/BANDAGES/DRESSINGS) ×2 IMPLANT
DRAIN CHANNEL 19F RND (DRAIN) ×6 IMPLANT
DRAPE LAPAROSCOPIC ABDOMINAL (DRAPES) ×3 IMPLANT
DRSG OPSITE POSTOP 3X4 (GAUZE/BANDAGES/DRESSINGS) ×6 IMPLANT
DRSG OPSITE POSTOP 4X6 (GAUZE/BANDAGES/DRESSINGS) ×6 IMPLANT
DRSG PAD ABDOMINAL 8X10 ST (GAUZE/BANDAGES/DRESSINGS) ×6 IMPLANT
ELECT BLADE 4.0 EZ CLEAN MEGAD (MISCELLANEOUS)
ELECT REM PT RETURN 9FT ADLT (ELECTROSURGICAL) ×3
ELECTRODE BLDE 4.0 EZ CLN MEGD (MISCELLANEOUS) IMPLANT
ELECTRODE REM PT RTRN 9FT ADLT (ELECTROSURGICAL) ×1 IMPLANT
EVACUATOR SILICONE 100CC (DRAIN) ×6 IMPLANT
GLOVE BIO SURGEON STRL SZ 6.5 (GLOVE) ×12 IMPLANT
GLOVE BIO SURGEONS STRL SZ 6.5 (GLOVE) ×6
GOWN STRL REUS W/ TWL LRG LVL3 (GOWN DISPOSABLE) ×2 IMPLANT
GOWN STRL REUS W/TWL LRG LVL3 (GOWN DISPOSABLE) ×6
NDL SAFETY ECLIPSE 18X1.5 (NEEDLE) IMPLANT
NEEDLE HYPO 18GX1.5 SHARP (NEEDLE)
NEEDLE HYPO 25X1 1.5 SAFETY (NEEDLE) ×3 IMPLANT
NS IRRIG 1000ML POUR BTL (IV SOLUTION) ×3 IMPLANT
PACK BASIN DAY SURGERY FS (CUSTOM PROCEDURE TRAY) ×3 IMPLANT
PAD ALCOHOL SWAB (MISCELLANEOUS) IMPLANT
PENCIL SMOKE EVACUATOR (MISCELLANEOUS) ×3 IMPLANT
SLEEVE SCD COMPRESS KNEE MED (MISCELLANEOUS) ×3 IMPLANT
SPONGE LAP 18X18 RF (DISPOSABLE) ×15 IMPLANT
STRIP SUTURE WOUND CLOSURE 1/2 (MISCELLANEOUS) ×6 IMPLANT
SUT MNCRL AB 4-0 PS2 18 (SUTURE) ×33 IMPLANT
SUT MON AB 3-0 SH 27 (SUTURE) ×42
SUT MON AB 3-0 SH27 (SUTURE) ×14 IMPLANT
SUT MON AB 5-0 PS2 18 (SUTURE) ×21 IMPLANT
SUT PDS 3-0 CT2 (SUTURE)
SUT PDS AB 2-0 CT2 27 (SUTURE) IMPLANT
SUT PDS II 3-0 CT2 27 ABS (SUTURE) IMPLANT
SUT SILK 3 0 PS 1 (SUTURE) ×6 IMPLANT
SUT VIC AB 3-0 SH 27 (SUTURE)
SUT VIC AB 3-0 SH 27X BRD (SUTURE) IMPLANT
SUT VICRYL 4-0 PS2 18IN ABS (SUTURE) IMPLANT
SYR 3ML 23GX1 SAFETY (SYRINGE) ×3 IMPLANT
SYR 50ML LL SCALE MARK (SYRINGE) IMPLANT
SYR BULB IRRIG 60ML STRL (SYRINGE) ×3 IMPLANT
SYR CONTROL 10ML LL (SYRINGE) ×3 IMPLANT
TAPE MEASURE VINYL STERILE (MISCELLANEOUS) IMPLANT
TOWEL GREEN STERILE FF (TOWEL DISPOSABLE) ×6 IMPLANT
TRAY DSU PREP LF (CUSTOM PROCEDURE TRAY) ×3 IMPLANT
TUBE CONNECTING 20'X1/4 (TUBING) ×1
TUBE CONNECTING 20X1/4 (TUBING) ×2 IMPLANT
TUBING INFILTRATION IT-10001 (TUBING) IMPLANT
TUBING SET GRADUATE ASPIR 12FT (MISCELLANEOUS) IMPLANT
UNDERPAD 30X36 HEAVY ABSORB (UNDERPADS AND DIAPERS) ×6 IMPLANT
YANKAUER SUCT BULB TIP NO VENT (SUCTIONS) ×3 IMPLANT

## 2020-05-09 NOTE — Op Note (Signed)
Breast Reduction Op note:    DATE OF PROCEDURE: 05/09/2020  LOCATION: [facility]  SURGEON: Alan Ripper Sanger Jehieli Brassell, DO  ASSISTANT: Joni Fears, PA  PREOPERATIVE DIAGNOSIS 1. Macromastia 2. Neck Pain 3. Back Pain  POSTOPERATIVE DIAGNOSIS 1. Macromastia 2. Neck Pain 3. Back Pain  PROCEDURES 1. Bilateral breast reduction.  Right reduction 1428 g, Left reduction 1368 g  COMPLICATIONS: None.  DRAINS: Bilateral  INDICATIONS FOR PROCEDURE Tiffany Young is a 49 y.o. year-old female born on Nov 13, 1970,with a history of symptomatic macromastia with concominant back pain, neck pain, shoulder grooving from her bra.   MRN: [id number]  CONSENT Informed consent was obtained directly from the patient. The risks, benefits and alternatives were fully discussed. Specific risks including but not limited to bleeding, infection, hematoma, seroma, scarring, pain, nipple necrosis, asymmetry, poor cosmetic results, and need for further surgery were discussed. The patient had ample opportunity to have her questions answered to her satisfaction.  DESCRIPTION OF PROCEDURE  Patient was brought into the operating room and placed in a supine position.  SCDs were placed and appropriate padding was performed.  Antibiotics were given. The patient underwent general anesthesia and the chest was prepped and draped in a sterile fashion.  A timeout was performed and all information was confirmed to be correct.  Tumescent was placed in the lateral and inferior portion of the breast.  Right side: Preoperative markings were confirmed.  Incision lines were injected with 1% Xylocaine with epinephrine.  After waiting for vasoconstriction, the marked lines were incised.  Liposuction was done in the lateral portion of the breast.  A Wise-pattern superomedial breast reduction was performed by de-epithelializing the pedicle, using bovie to create the superomedial pedicle, and removing breast  tissue from the lateral and inferior portions of the breast.  Care was taken to not undermine the breast pedicle. Hemostasis was achieved.  The nipple was gently rotated into position and the soft tissue closed with 4-0 Monocryl.   A drain was placed and secured to the skin with a 3-0 silk.  Hemostasis confirmed.  The deep tissues were approximated with 3-0 monocryl sutures and the skin was closed with deep dermal and subcuticular 4-0 Monocryl sutures.  The nipple and skin flaps had good capillary refill at the end of the procedure.    Left side: Preoperative markings were confirmed.  Incision lines were injected with 1% Xylocaine with epinephrine.  After waiting for vasoconstriction, the marked lines were incised.  Liposuction was done in the lateral portion of the breast.  A Wise-pattern superomedial breast reduction was performed by de-epithelializing the pedicle, using bovie to create the superomedial pedicle, and removing breast tissue from the lateral and inferior portions of the breast.  Care was taken to not undermine the breast pedicle. Hemostasis was achieved.  The nipple was gently rotated into position and the soft tissue was closed with 4-0 Monocryl.  The patient was sat upright and size and shape symmetry was confirmed. Hemostasis confirmed.  A drain was placed and secured to the skin with a 3-0 silk.  The deep tissues were approximated with 3-0 monocryl sutures and the skin was closed with deep dermal and subcuticular 4-0 Monocryl sutures.  Dermabond was applied.  A breast binder and ABDs were placed.  The nipple and skin flaps had good capillary refill at the end of the procedure.  The patient tolerated the procedure well. The patient was allowed to wake from anesthesia and taken to the recovery room in satisfactory condition.  The advanced practice practitioner (APP) assisted throughout the case.  The APP was essential in retraction and counter traction when needed to make the case progress  smoothly.  This retraction and assistance made it possible to see the tissue plans for the procedure.  The assistance was needed for blood control, tissue re-approximation and assisted with closure of the incision site.

## 2020-05-09 NOTE — Interval H&P Note (Signed)
History and Physical Interval Note:  05/09/2020 12:03 PM  Tiffany Young  has presented today for surgery, with the diagnosis of mammary hypertrophy.  The various methods of treatment have been discussed with the patient and family. After consideration of risks, benefits and other options for treatment, the patient has consented to  Procedure(s) with comments: BREAST REDUCTION WITH LIPOSUCTION (Bilateral) - 3.5 hours, please as a surgical intervention.  The patient's history has been reviewed, patient examined, no change in status, stable for surgery.  I have reviewed the patient's chart and labs.  Questions were answered to the patient's satisfaction.     Alena Bills Marrell Dicaprio

## 2020-05-09 NOTE — Transfer of Care (Signed)
Immediate Anesthesia Transfer of Care Note  Patient: Tiffany Young. Tiffany Young  Procedure(s) Performed: BILATERAL BREAST REDUCTION WITH LIPOSUCTION (Bilateral Breast)  Patient Location: PACU  Anesthesia Type:General  Level of Consciousness: awake and alert   Airway & Oxygen Therapy: Patient Spontanous Breathing and Patient connected to face mask oxygen  Post-op Assessment: Report given to RN and Post -op Vital signs reviewed and stable  Post vital signs: Reviewed and stable  Last Vitals:  Vitals Value Taken Time  BP    Temp    Pulse 127 05/09/20 1632  Resp    SpO2 100 % 05/09/20 1632  Vitals shown include unvalidated device data.  Last Pain:  Vitals:   05/09/20 1140  TempSrc: Oral  PainSc: 0-No pain         Complications: No complications documented.

## 2020-05-09 NOTE — Anesthesia Postprocedure Evaluation (Signed)
Anesthesia Post Note  Patient: Tiffany Young  Procedure(s) Performed: BILATERAL BREAST REDUCTION WITH LIPOSUCTION (Bilateral Breast)     Patient location during evaluation: PACU Anesthesia Type: General Level of consciousness: awake and sedated Pain management: pain level controlled Vital Signs Assessment: post-procedure vital signs reviewed and stable Respiratory status: spontaneous breathing Cardiovascular status: stable Postop Assessment: no apparent nausea or vomiting Anesthetic complications: no   No complications documented.  Last Vitals:  Vitals:   05/09/20 1706 05/09/20 1715  BP:  135/72  Pulse:  (!) 105  Resp: 12 19  Temp:    SpO2:  94%    Last Pain:  Vitals:   05/09/20 1715  TempSrc:   PainSc: 5                  John F Katlin Ciszewski Jr

## 2020-05-09 NOTE — Anesthesia Preprocedure Evaluation (Addendum)
Anesthesia Evaluation  Patient identified by MRN, date of birth, ID band Patient awake    Reviewed: Allergy & Precautions, NPO status , Patient's Chart, lab work & pertinent test results  History of Anesthesia Complications (+) PONV and history of anesthetic complications  Airway Mallampati: II       Dental no notable dental hx. (+) Teeth Intact   Pulmonary neg pulmonary ROS,    Pulmonary exam normal breath sounds clear to auscultation       Cardiovascular negative cardio ROS Normal cardiovascular exam Rhythm:Regular Rate:Normal     Neuro/Psych negative psych ROS   GI/Hepatic Neg liver ROS,   Endo/Other  Morbid obesity  Renal/GU   negative genitourinary   Musculoskeletal   Abdominal (+) + obese,   Peds  Hematology   Anesthesia Other Findings   Reproductive/Obstetrics negative OB ROS                             Anesthesia Physical Anesthesia Plan  ASA: III  Anesthesia Plan: General   Post-op Pain Management:    Induction:   PONV Risk Score and Plan: 4 or greater and Ondansetron, Dexamethasone, Scopolamine patch - Pre-op and Midazolam  Airway Management Planned: Oral ETT  Additional Equipment: None  Intra-op Plan:   Post-operative Plan: Extubation in OR  Informed Consent: I have reviewed the patients History and Physical, chart, labs and discussed the procedure including the risks, benefits and alternatives for the proposed anesthesia with the patient or authorized representative who has indicated his/her understanding and acceptance.     Dental advisory given  Plan Discussed with: CRNA  Anesthesia Plan Comments:         Anesthesia Quick Evaluation

## 2020-05-09 NOTE — Discharge Instructions (Addendum)
INSTRUCTIONS FOR AFTER SURGERY   You will likely have some questions about what to expect following your operation.  The following information will help you and your family understand what to expect when you are discharged from the hospital.  Following these guidelines will help ensure a smooth recovery and reduce risks of complications.  Postoperative instructions include information on: diet, wound care, medications and physical activity.  AFTER SURGERY Expect to go home after the procedure.  In some cases, you may need to spend one night in the hospital for observation.  DIET This surgery does not require a specific diet.  However, I have to mention that the healthier you eat the better your body can start healing. It is important to increasing your protein intake.  This means limiting the foods with added sugar.  Focus on fruits and vegetables and some meat.  If you have any liposuction during your procedure be sure to drink water.  If your urine is bright yellow, then it is concentrated, and you need to drink more water.  As a general rule after surgery, you should have 8 ounces of water every hour while awake.  If you find you are persistently nauseated or unable to take in liquids let us know.  NO TOBACCO USE or EXPOSURE.  This will slow your healing process and increase the risk of a wound.  WOUND CARE If you have a drain: Clean with baby wipes for 3-5 days.   If you have steri-strips / tape directly attached to your skin leave them in place. It is OK to get these wet.  No baths, pools or hot tubs for two weeks. We close your incision to leave the smallest and best-looking scar. No ointment or creams on your incisions until given the go ahead.  Especially not Neosporin (Too many skin reactions with this one).  A few weeks after surgery you can use Mederma and start massaging the scar. We ask you to wear your binder or sports bra for the first 6 weeks around the clock, including while sleeping.  This provides added comfort and helps reduce the fluid accumulation at the surgery site.  ACTIVITY No heavy lifting until cleared by the doctor.  It is OK to walk and climb stairs. In fact, moving your legs is very important to decrease your risk of a blood clot.  It will also help keep you from getting deconditioned.  Every 1 to 2 hours get up and walk for 5 minutes. This will help with a quicker recovery back to normal.  Let pain be your guide so you don't do too much.  NO, you cannot do the spring cleaning and don't plan on taking care of anyone else.  This is your time for TLC.   WORK Everyone returns to work at different times. As a rough guide, most people take at least 1 - 2 weeks off prior to returning to work. If you need documentation for your job, bring the forms to your postoperative follow up visit.  DRIVING Arrange for someone to bring you home from the hospital.  You may be able to drive a few days after surgery but not while taking any narcotics or valium.  BOWEL MOVEMENTS Constipation can occur after anesthesia and while taking pain medication.  It is important to stay ahead for your comfort.  We recommend taking Milk of Magnesia (2 tablespoons; twice a day) while taking the pain pills.  SEROMA This is fluid your body tried to put  in the surgical site.  This is normal but if it creates excessive pain and swelling let us know.  It usually decreases in a few weeks.  MEDICATIONS and PAIN CONTROL At your preoperative visit for you history and physical you were given the following medications: 1. An antibiotic: Start this medication when you get home and take according to the instructions on the bottle. 2. Zofran 4 mg:  This is to treat nausea and vomiting.  You can take this every 6 hours as needed and only if needed. 3. Norco (hydrocodone/acetaminophen) 5/325 mg:  This is only to be used after you have taken the motrin or the tylenol. Every 8 hours as needed. Over the counter  Medication to take: 4. Ibuprofen (Motrin) 600 mg:  Take this every 6 hours.  If you have additional pain then take 500 mg of the tylenol.  Only take the Norco after you have tried these two. 5. Miralax or stool softener of choice: Take this according to the bottle if you take the Norco.  WHEN TO CALL Call your surgeon's office if any of the following occur: . Fever 101 degrees F or greater . Excessive bleeding or fluid from the incision site. . Pain that increases over time without aid from the medications . Redness, warmth, or pus draining from incision sites . Persistent nausea or inability to take in liquids . Severe misshapen area that underwent the operation.   [facility]  What is the benefit of having a drain?  During surgery your tissue layers are separated.  This raw surface stimulates your body to fill the space with serous fluid.  This is normal but you don't want that fluid to collect and prevent healing.  A fluid collection can also become infected.  The Jackson-Pratt (JP) drain is used to eliminate this collection of fluid and allow the tissue to heal together.    Jackson-Pratt (JP) bulb    How to care for your drainage and suction unit at home Your drainage catheter will be connected to a collection device. The vacuum caused when the device is compressed allows drainage to collect in the device.    Tiffany Young your hands with soap and water before and after touching the system. . Empty the JP drain every 12 hours once you get home from your procedure. . Record the fluid amount on the record sheet included. . Start with stripping the drain tube to push the clots or excess fluid to the bulb.  Do this by pinching the tube with one hand near your skin.  Then with the other hand squeeze the tubing and work it toward the bulb.  This should be done several times a day.  This may collapse the tube which will correct on its own.   . Use a safety pin to attach your  collection device to your clothing so there is no tension on the insertion site.   . If you have drainage at the skin insertion site, you can apply a gauze dressing and secure it with tape. . If the drain falls out, apply a gauze dressing over the drain insertion site and secure with tape.   To empty the collection device:   . Release the stopper on the top of the collection unit (bulb).  Reinaldo Meeker contents into a measuring container such as a plastic medicine cup.  . Record the day and amount of drainage on the attached sheet. . This should be done at least  twice a day.    To compress the Jackson-Pratt Bulb:  . Release the stopper at the top of the bulb. Marland Kitchen Squeeze the bulb tightly in your fist, squeezing air out of the bulb.  . Replace the stopper while the bulb is compressed.  . Be careful not to spill the contents when squeezing the bulb. . The drainage will start bright red and turn to pink and then yellow with time. . IMPORTANT: If the bulb is not squeezed before adding the stopper it will not draw out the fluid.  Care for the JP drain site and your skin daily:  . You may shower three days after surgery. . Secure the drain to a ribbon or cloth around your waist while showering so it does not pull out while showering. . Be sure your hands are cleaned with soap and water. . Use a clean wet cotton swab to clean the skin around the drain site.  . Use another cotton swab to place Vaseline or antibiotic ointment on the skin around the drain.     Contact your physician if any of the following occur:  Marland Kitchen The fluid in the bulb becomes cloudy. . Your temperature is greater than 101.4.  Marland Kitchen The incision opens. . If you have drainage at the skin insertion site, you can apply a gauze dressing and secure it with tape. . If the drain falls out, apply a gauze dressing over the drain insertion site and secure with tape.  . You will usually have more drainage when you are active than while you rest or are  asleep. If the drainage increases significantly or is bloody call the physician                             Bring this record with you to each office visit Date  Drainage Volume  Date   Drainage volume                                                                                                                                                                                          Post Anesthesia Home Care Instructions  Activity: Get plenty of rest for the remainder of the day. A responsible individual must stay with you for 24 hours following the procedure.  For the next 24 hours, DO NOT: -Drive a car -Advertising copywriter -Drink alcoholic beverages -Take any medication unless instructed by your physician -Make any legal decisions or sign important papers.  Meals: Start with liquid foods such as gelatin or soup. Progress to regular foods as tolerated. Avoid greasy, spicy, heavy  foods. If nausea and/or vomiting occur, drink only clear liquids until the nausea and/or vomiting subsides. Call your physician if vomiting continues.  Special Instructions/Symptoms: Your throat may feel dry or sore from the anesthesia or the breathing tube placed in your throat during surgery. If this causes discomfort, gargle with warm salt water. The discomfort should disappear within 24 hours.  If you had a scopolamine patch placed behind your ear for the management of post- operative nausea and/or vomiting:  1. The medication in the patch is effective for 72 hours, after which it should be removed.  Wrap patch in a tissue and discard in the trash. Wash hands thoroughly with soap and water. 2. You may remove the patch earlier than 72 hours if you experience unpleasant side effects which may include dry mouth, dizziness or visual disturbances. 3. Avoid touching the patch. Wash your hands with soap and water after contact with the patch.

## 2020-05-09 NOTE — Anesthesia Procedure Notes (Signed)
Procedure Name: LMA Insertion Date/Time: 05/09/2020 12:49 PM Performed by: Thornell Mule, CRNA Pre-anesthesia Checklist: Patient identified, Emergency Drugs available, Suction available and Patient being monitored Patient Re-evaluated:Patient Re-evaluated prior to induction Oxygen Delivery Method: Circle system utilized Preoxygenation: Pre-oxygenation with 100% oxygen Induction Type: IV induction Ventilation: Mask ventilation without difficulty and Oral airway inserted - appropriate to patient size Laryngoscope Size: Miller and 3 Grade View: Grade I Tube size: 7.0 mm Number of attempts: 1 Placement Confirmation: positive ETCO2,  ETT inserted through vocal cords under direct vision and breath sounds checked- equal and bilateral Secured at: 20 cm Tube secured with: Tape Dental Injury: Teeth and Oropharynx as per pre-operative assessment

## 2020-05-10 ENCOUNTER — Encounter (HOSPITAL_BASED_OUTPATIENT_CLINIC_OR_DEPARTMENT_OTHER): Payer: Self-pay | Admitting: Plastic Surgery

## 2020-05-10 NOTE — Addendum Note (Signed)
Addendum  created 05/10/20 1000 by Leanora Murin, Jewel Baize, CRNA   Charge Capture section accepted

## 2020-05-13 LAB — SURGICAL PATHOLOGY

## 2020-05-14 ENCOUNTER — Telehealth: Payer: Self-pay | Admitting: Plastic Surgery

## 2020-05-14 NOTE — Telephone Encounter (Signed)
Spoke to patient.  Overall she is doing well.  She can take Ibuprofen and Tylenol for pain.  Denies F, CP, SOB, N/V. She should wear compression 24/ 7 for 6 weeks.  Does not need to replace gauze unless she wants it for comfort since she's having no drainage. She should sleep wherever it's most comfortable to sleep on her back.  Answered all patient's questions to her satisfaction.

## 2020-05-14 NOTE — Telephone Encounter (Signed)
Patient called to advise she is having shocks of pain in her left areola. Feels like it's pulling from under her arm up to the left ariola like a "streak of lightening" or something ripping. Happens when she bends over and stretching in the morning. It's only on the one side. Is this normal? Pain started Saturday.  Patient also wanted to know if she needs to keep the compression vest on until next appt. There was a pad under right breast that came out and fell in the toilet so she replaced it with wash cloth.   Can she started sleeping her bed or does need to sleep in chair?   Please call patient to advise.

## 2020-05-16 NOTE — Interval H&P Note (Signed)
History and Physical Interval Note:  05/16/2020 1:44 PM  Tiffany Young  has presented today for surgery, with the diagnosis of mammary hypertrophy.  The various methods of treatment have been discussed with the patient and family. After consideration of risks, benefits and other options for treatment, the patient has consented to  Procedure(s) with comments: BILATERAL BREAST REDUCTION WITH LIPOSUCTION (Bilateral) - 3.5 hours, please as a surgical intervention.  The patient's history has been reviewed, patient examined, no change in status, stable for surgery.  I have reviewed the patient's chart and labs.  Questions were answered to the patient's satisfaction.     Alena Bills Alayja Armas

## 2020-05-17 ENCOUNTER — Encounter: Payer: Self-pay | Admitting: Plastic Surgery

## 2020-05-17 ENCOUNTER — Other Ambulatory Visit: Payer: Self-pay

## 2020-05-17 ENCOUNTER — Ambulatory Visit (INDEPENDENT_AMBULATORY_CARE_PROVIDER_SITE_OTHER): Payer: 59 | Admitting: Plastic Surgery

## 2020-05-17 VITALS — BP 113/67 | HR 88 | Temp 98.3°F

## 2020-05-17 DIAGNOSIS — N62 Hypertrophy of breast: Secondary | ICD-10-CM

## 2020-05-17 NOTE — Progress Notes (Signed)
The patient is a 49 year old female here for follow-up after undergoing bilateral breast reduction.  Overall she is doing very well.  There is still swelling on both sides.  Drain output has been very minimal.  I was able to remove the drains today.  She denies any fever.  Pain is well controlled.  No sign of infection.  The nipple areolas are quite bruised.  I like her to use Xeroform on these and change it every day or 2.  Then switch to Vaseline.  I would like to see her back in a week.  We talked about there may be some sloughing and opening up of some of the incisions.  Her honeycomb dressings are still in place.  She can start to work those off on Monday.  Try and keep the Steri-Strips in place.  Call with any questions or concerns.

## 2020-05-20 ENCOUNTER — Ambulatory Visit (INDEPENDENT_AMBULATORY_CARE_PROVIDER_SITE_OTHER): Payer: 59 | Admitting: Bariatrics

## 2020-05-20 ENCOUNTER — Encounter (INDEPENDENT_AMBULATORY_CARE_PROVIDER_SITE_OTHER): Payer: Self-pay | Admitting: Bariatrics

## 2020-05-20 ENCOUNTER — Other Ambulatory Visit: Payer: Self-pay

## 2020-05-20 VITALS — BP 120/78 | HR 84 | Temp 98.3°F | Ht 65.0 in | Wt 265.0 lb

## 2020-05-20 DIAGNOSIS — E7849 Other hyperlipidemia: Secondary | ICD-10-CM

## 2020-05-20 DIAGNOSIS — Z6841 Body Mass Index (BMI) 40.0 and over, adult: Secondary | ICD-10-CM | POA: Diagnosis not present

## 2020-05-20 DIAGNOSIS — M549 Dorsalgia, unspecified: Secondary | ICD-10-CM

## 2020-05-21 ENCOUNTER — Encounter (INDEPENDENT_AMBULATORY_CARE_PROVIDER_SITE_OTHER): Payer: Self-pay | Admitting: Bariatrics

## 2020-05-21 ENCOUNTER — Telehealth: Payer: Self-pay | Admitting: Plastic Surgery

## 2020-05-21 ENCOUNTER — Encounter: Payer: Self-pay | Admitting: Plastic Surgery

## 2020-05-21 NOTE — Progress Notes (Signed)
Chief Complaint:   OBESITY Tiffany Young is here to discuss her progress with her obesity treatment plan along with follow-up of her obesity related diagnoses. Tiffany Young is on the Category 2 Plan and states she is following her eating plan approximately 70% of the time. Tiffany Young states she is exercising 0 minutes 0 times per week.  Today's visit was #: 3 Starting weight: 270 lbs Starting date: 04/15/2020 Today's weight: 265 lbs Today's date: 05/20/2020 Total lbs lost to date: 5 Total lbs lost since last in-office visit: 0  Interim History: Tiffany Young's weight remains the same. She had a breast reduction and liposuction 05/09/2020 and is doing well overall.  Subjective:   Other hyperlipidemia. Tiffany Young is taking Lipitor.   No results found for: CHOL, HDL, LDLCALC, LDLDIRECT, TRIG, CHOLHDL Lab Results  Component Value Date   ALT 15 03/26/2019   AST 18 03/26/2019   ALKPHOS 41 03/26/2019   BILITOT 0.6 03/26/2019   The ASCVD Risk score Denman George DC Jr., et al., 2013) failed to calculate for the following reasons:   Cannot find a previous HDL lab   Cannot find a previous total cholesterol lab  Back pain, unspecified back location, unspecified back pain laterality, unspecified chronicity. Tiffany Young had breast reduction and has reduced back pain.  Assessment/Plan:   Other hyperlipidemia. Cardiovascular risk and specific lipid/LDL goals reviewed.  We discussed several lifestyle modifications today and Tiffany Young will continue to work on diet, exercise and weight loss efforts. Orders and follow up as documented in patient record. She will continue Lipitor as directed.   Counseling Intensive lifestyle modifications are the first line treatment for this issue. . Dietary changes: Increase soluble fiber. Decrease simple carbohydrates. . Exercise changes: Moderate to vigorous-intensity aerobic activity 150 minutes per week if tolerated. . Lipid-lowering medications: see documented in medical  record.  Back pain, unspecified back location, unspecified back pain laterality, unspecified chronicity. PCP will continue to monitor. She will follow-up with her plastic surgeon as scheduled and as directed.   Class 3 severe obesity with serious comorbidity and body mass index (BMI) of 40.0 to 44.9 in adult, unspecified obesity type (HCC).  Tiffany Young is currently in the action stage of change. As such, her goal is to continue with weight loss efforts. She has agreed to the Category 2 Plan.   She will work on meal planning, intentional eating, and will get back on track.   Exercise goals: All adults should avoid inactivity. Some physical activity is better than none, and adults who participate in any amount of physical activity gain some health benefits.  Behavioral modification strategies: increasing lean protein intake, decreasing simple carbohydrates, increasing vegetables, increasing water intake, decreasing eating out, no skipping meals, meal planning and cooking strategies, keeping healthy foods in the home and planning for success.  Tiffany Young has agreed to follow-up with our clinic in 2 weeks. She was informed of the importance of frequent follow-up visits to maximize her success with intensive lifestyle modifications for her multiple health conditions.   Objective:   Blood pressure 120/78, pulse 84, temperature 98.3 F (36.8 C), height 5\' 5"  (1.651 m), weight 265 lb (120.2 kg), SpO2 98 %. Body mass index is 44.1 kg/m.  General: Cooperative, alert, well developed, in no acute distress. HEENT: Conjunctivae and lids unremarkable. Cardiovascular: Regular rhythm.  Lungs: Normal work of breathing. Neurologic: No focal deficits.   Lab Results  Component Value Date   CREATININE 0.74 04/14/2020   BUN 15 04/14/2020   NA 140 04/14/2020  K 4.1 04/14/2020   CL 104 04/14/2020   CO2 29 04/14/2020   Lab Results  Component Value Date   ALT 15 03/26/2019   AST 18 03/26/2019   ALKPHOS 41  03/26/2019   BILITOT 0.6 03/26/2019   Lab Results  Component Value Date   HGBA1C 5.7 (H) 04/15/2020   Lab Results  Component Value Date   INSULIN 17.3 04/15/2020   Lab Results  Component Value Date   TSH 1.540 04/15/2020   No results found for: CHOL, HDL, LDLCALC, LDLDIRECT, TRIG, CHOLHDL Lab Results  Component Value Date   WBC 5.8 04/14/2020   HGB 13.8 04/14/2020   HCT 42.3 04/14/2020   MCV 95.9 04/14/2020   PLT 252 04/14/2020   No results found for: IRON, TIBC, FERRITIN  Attestation Statements:   Reviewed by clinician on day of visit: allergies, medications, problem list, medical history, surgical history, family history, social history, and previous encounter notes.  Time spent on visit including pre-visit chart review and post-visit charting and care was 20 minutes.   Fernanda Drum, am acting as Energy manager for [employer], DO   I have reviewed the above documentation for accuracy and completeness, and I agree with the above. Corinna Capra, DO

## 2020-05-22 ENCOUNTER — Telehealth: Payer: Self-pay

## 2020-05-22 ENCOUNTER — Encounter: Payer: Self-pay | Admitting: Surgical

## 2020-05-22 ENCOUNTER — Ambulatory Visit (INDEPENDENT_AMBULATORY_CARE_PROVIDER_SITE_OTHER): Payer: 59 | Admitting: Surgical

## 2020-05-22 ENCOUNTER — Other Ambulatory Visit: Payer: Self-pay

## 2020-05-22 VITALS — BP 128/81 | HR 99 | Temp 98.9°F

## 2020-05-22 DIAGNOSIS — M546 Pain in thoracic spine: Secondary | ICD-10-CM

## 2020-05-22 DIAGNOSIS — N62 Hypertrophy of breast: Secondary | ICD-10-CM

## 2020-05-22 DIAGNOSIS — G8929 Other chronic pain: Secondary | ICD-10-CM

## 2020-05-22 DIAGNOSIS — M542 Cervicalgia: Secondary | ICD-10-CM

## 2020-05-22 MED ORDER — DOXYCYCLINE HYCLATE 100 MG PO TABS
100.0000 mg | ORAL_TABLET | Freq: Two times a day (BID) | ORAL | 0 refills | Status: AC
Start: 2020-05-22 — End: 2020-05-27

## 2020-05-22 MED ORDER — FLUCONAZOLE 150 MG PO TABS: 150.0000 mg | ORAL_TABLET | Freq: Once | ORAL | 0 refills | Status: AC

## 2020-05-22 NOTE — Telephone Encounter (Signed)
Faxed order for Tiffany Young to [facility]: ABD pads, 4x4 guaze, xeroform to be changed daily

## 2020-05-22 NOTE — Progress Notes (Signed)
Patient is a 49 year old female here for follow-up after bilateral breast reduction with Dr. Ulice Bold on 05/09/2020.  She is just shy of 2 weeks postop.  She is here for evaluation of her right breast.  She reports she noticed a wound after removing honeycomb dressing from the right breast.  She reports that she is overall feeling well, has noticed some chills but no fevers or nausea or vomiting.  She reports she has been applying Xeroform to bilateral NAC's as well as some Xeroform to the right breast wound.  Chaperone present on exam On exam bilateral NAC's with superficial epithelium sloughing, no full-thickness necrosis noted or foul odor noted.  Bilateral NAC incisions are in-tact.  She does have a combination of dehiscence/blistering along the right vertical limb. This wound is approximately 5 x 3 x 1 cm.  Some periwound erythema, no cellulitic changes.    The bilateral NAC sloughing is 3 x 4 cm on each side.  There is no periwound erythema of the NAC's.  Right breast is nontender to palpation.  Left breast slightly tender to palpation at the junction of the vertical limb and inframammary fold junction.  No fluid wave noted, slight swelling noted.  Recommend continuing to apply Xeroform to bilateral NAC's, apply Xeroform to right breast wound.  Recommend doing this daily followed by 4 x 4 gauze and ABD pads.  We will order patient supplies through [facility].  I discussed with her that due to the erythema surrounding the right breast wound, an antibiotic for 5 days will be prescribed.  Patient reports a strong history of yeast infections after taking antibiotics and would like Diflucan.  I discussed with her that I am happy to call this in for her, but only pick up from the pharmacy if she develops any symptoms of yeast infection.  She has a follow-up scheduled for 2 days from now.  I discussed with her that she can keep this appointment open at this time, but if she is doing well tomorrow I do not feel  as if she needs to come back to be evaluated.  We will schedule a follow-up in 1 week to reevaluate.  Patient is in agreement with this plan.  I discussed with her to call if she has any questions or concerns or develops any new symptoms.  Pictures were obtained of the patient and placed in the chart with the patient's or guardian's permission.

## 2020-05-23 NOTE — Progress Notes (Deleted)
Patient is a 49 year old female here for follow-up after undergoing bilateral breast reduction with Dr. Ulice Bold on 05/09/2020.  She is here for follow-up for a wound she noticed after removing the honeycomb dressing on the right breast.  Placed on 5 days of antibiotics on 05/22/2020.  ~ 2 weeks PO

## 2020-05-24 ENCOUNTER — Encounter: Payer: 59 | Admitting: Plastic Surgery

## 2020-05-24 ENCOUNTER — Telehealth: Payer: Self-pay | Admitting: Surgical

## 2020-05-24 NOTE — Telephone Encounter (Signed)
Patient said Tiffany Young would call in some medicated strips and they have not received them yet. She wasn't sure if he still wanted her on them or not but wanted him to know nothing has come yet. Please call her to advise if she needs to do something different in the interim or if he needs to do something else.

## 2020-05-24 NOTE — Telephone Encounter (Addendum)
We had discussed ordering xeroform for patient, order was faxed through [facility]. Please check with tracy kreis, CMA if this has been ordered. I called patient and provided [facility] number to call and check on status. I instructed her to use vaseline/gauze if she runs out of the xeroform I provided her. She reports it is doing better. She can shower.  Call with questions/concerns.

## 2020-05-24 NOTE — Telephone Encounter (Signed)
Order was faxed on 05/22/20. Received confirmation/sucess from fax the same day. Never received "order notification status" [facility]. Tried to call [facility] to confirm, but was not able to speak with a customer service.  Refaxed order to make sure they received.

## 2020-05-26 MED ORDER — DOXYCYCLINE HYCLATE 100 MG PO TABS
100.0000 mg | ORAL_TABLET | Freq: Two times a day (BID) | ORAL | 0 refills | Status: DC
Start: 2020-05-26 — End: 2020-06-11

## 2020-05-26 NOTE — Telephone Encounter (Signed)
Patient called with concerns about more drainage and ending her doxy today.  Will continue doxy and send in a refill.  We will plan to see her early this week.  She will call the office tomorrow and come in on Tuesday.

## 2020-05-27 NOTE — Telephone Encounter (Signed)
Called patient to follow up if patient heard from [facility]. She did speak with them, but they were having issues verifying  insurance information. I advised her they can offer the products at a lower cost. She has a follow up appointment with Dr. Ulice Bold tomorrow. If needed, she said she can buy the items herself.

## 2020-05-28 ENCOUNTER — Encounter: Payer: Self-pay | Admitting: Plastic Surgery

## 2020-05-28 ENCOUNTER — Encounter (HOSPITAL_BASED_OUTPATIENT_CLINIC_OR_DEPARTMENT_OTHER): Payer: Self-pay | Admitting: Plastic Surgery

## 2020-05-28 ENCOUNTER — Ambulatory Visit (INDEPENDENT_AMBULATORY_CARE_PROVIDER_SITE_OTHER): Payer: 59 | Admitting: Plastic Surgery

## 2020-05-28 ENCOUNTER — Other Ambulatory Visit: Payer: Self-pay

## 2020-05-28 ENCOUNTER — Telehealth: Payer: Self-pay | Admitting: Plastic Surgery

## 2020-05-28 VITALS — BP 119/75 | HR 102 | Temp 99.1°F

## 2020-05-28 DIAGNOSIS — N62 Hypertrophy of breast: Secondary | ICD-10-CM | POA: Insufficient documentation

## 2020-05-28 DIAGNOSIS — S21001A Unspecified open wound of right breast, initial encounter: Secondary | ICD-10-CM

## 2020-05-28 NOTE — Telephone Encounter (Signed)
Patient called to ask if she needed to stop taking antibiotics before surgery and does she need to stop regular meds before surgery. Please call to advise.

## 2020-05-28 NOTE — Progress Notes (Signed)
° °  Subjective:    Patient ID: Tiffany Young, female    DOB: 22-Dec-1970, 49 y.o.   MRN: [id number]  The patient is a 49 year old female here for follow-up on her bilateral breast reduction.  She has a small opening at the vertical limb on the left breast.  It is at the inframammary fold.  On the right breast she has an opening of the entire vertical limb.  There is quite a bit of draining on the right side.  It does not appear to be infected but there is certainly necrotic tissue.  She is otherwise doing well.     Review of Systems  Constitutional: Positive for activity change. Negative for appetite change.  Eyes: Negative.   Respiratory: Negative.  Negative for chest tightness and shortness of breath.   Cardiovascular: Negative for leg swelling.  Gastrointestinal: Negative for abdominal distention.  Endocrine: Negative.   Genitourinary: Negative.   Hematological: Negative.        Objective:   Physical Exam Vitals and nursing note reviewed.  Constitutional:      Appearance: Normal appearance.  HENT:     Head: Normocephalic and atraumatic.  Cardiovascular:     Rate and Rhythm: Normal rate.     Pulses: Normal pulses.  Pulmonary:     Effort: Pulmonary effort is normal.  Chest:    Skin:    Capillary Refill: Capillary refill takes less than 2 seconds.  Neurological:     General: No focal deficit present.     Mental Status: She is alert and oriented to person, place, and time.  Psychiatric:        Mood and Affect: Mood normal.        Behavior: Behavior normal.       Assessment & Plan:     ICD-10-CM   1. Breast wound, right, initial encounter  S21.001A   2. Symptomatic mammary hypertrophy  N62      Recommend the OR for excision of bilateral breast wounds and closure. Pictures were obtained of the patient and placed in the chart with the patient's or guardian's permission.

## 2020-05-28 NOTE — Telephone Encounter (Signed)
Returned pt call re:  She had questions about whether to continue to take her RX (antibiotics) and her other scheduled meds prior to her next upcoming surgery I consulted with Dr. Ulice Bold & she confirmed that she can take her antibiotics & other meds as scheduled unless she is taking any "blood thinners" She only takes the antibiotic & Lipitor at this time- I instructed her to continue them. She did request that if possible she would like her surgery on Friday instead of Thursday d/t scheduling conflicts with her family I also consulted with Osvaldo Human & she has next Monday 06/03/20 @ 1pm held for her surgery This day/time works for Dr. Ulice Bold & the pt. Osvaldo Human will call pt to discuss the details of this upcoming surgery and her arrival time to [facility] Pt understands the plan of care

## 2020-05-31 ENCOUNTER — Encounter: Payer: Self-pay | Admitting: Plastic Surgery

## 2020-05-31 ENCOUNTER — Ambulatory Visit (INDEPENDENT_AMBULATORY_CARE_PROVIDER_SITE_OTHER): Payer: 59 | Admitting: Plastic Surgery

## 2020-05-31 ENCOUNTER — Ambulatory Visit: Payer: 59 | Admitting: Plastic Surgery

## 2020-05-31 ENCOUNTER — Telehealth: Payer: Self-pay

## 2020-05-31 ENCOUNTER — Other Ambulatory Visit (HOSPITAL_COMMUNITY)
Admission: RE | Admit: 2020-05-31 | Discharge: 2020-05-31 | Disposition: A | Discharge status: Home / Self Care | Payer: 59 | Source: Ambulatory Visit | Attending: Plastic Surgery | Admitting: Plastic Surgery

## 2020-05-31 ENCOUNTER — Other Ambulatory Visit: Payer: Self-pay

## 2020-05-31 VITALS — BP 125/63 | HR 85 | Temp 99.1°F

## 2020-05-31 DIAGNOSIS — N62 Hypertrophy of breast: Secondary | ICD-10-CM

## 2020-05-31 DIAGNOSIS — Z20822 Contact with and (suspected) exposure to covid-19: Secondary | ICD-10-CM | POA: Diagnosis not present

## 2020-05-31 DIAGNOSIS — Z01812 Encounter for preprocedural laboratory examination: Secondary | ICD-10-CM | POA: Insufficient documentation

## 2020-05-31 LAB — SARS CORONAVIRUS 2 (TAT 6-24 HRS): SARS Coronavirus 2: NEGATIVE

## 2020-05-31 NOTE — Progress Notes (Signed)
The patient is a 49 year old female here for follow-up on her right breast wound.  She noticed that it had opened up a little bit more and called the office.  We told her to come on it and we would do dressing change.  I placed some donated away since powder on the wound.  It actually looks a little bit better with some improved granulation.  We are planning on going to the OR Monday and debriding and possibly closing the wounds.

## 2020-05-31 NOTE — Telephone Encounter (Signed)
Received call from [facility] requesting wound care orders for Tiffany Young. They can be reached on the phone at: [phone].

## 2020-06-02 NOTE — Anesthesia Preprocedure Evaluation (Deleted)
Anesthesia Evaluation    Reviewed: Allergy & Precautions, Patient's Chart, lab work & pertinent test results  History of Anesthesia Complications (+) PONV and history of anesthetic complications  Airway Mallampati: II  TM Distance: >3 FB Neck ROM: Full    Dental no notable dental hx.    Pulmonary neg pulmonary ROS,    Pulmonary exam normal breath sounds clear to auscultation       Cardiovascular Exercise Tolerance: Good negative cardio ROS Normal cardiovascular exam Rhythm:Regular Rate:Normal     Neuro/Psych negative neurological ROS  negative psych ROS   GI/Hepatic GERD  ,  Endo/Other  Morbid obesity  Renal/GU      Musculoskeletal negative musculoskeletal ROS (+)   Abdominal (+) + obese,   Peds  Hematology negative hematology ROS (+)   Anesthesia Other Findings   Reproductive/Obstetrics                                                             Anesthesia Evaluation  Patient identified by MRN, date of birth, ID band Patient awake    Reviewed: Allergy & Precautions, NPO status , Patient's Chart, lab work & pertinent test results  History of Anesthesia Complications (+) PONV and history of anesthetic complications  Airway Mallampati: II       Dental no notable dental hx. (+) Teeth Intact   Pulmonary neg pulmonary ROS,    Pulmonary exam normal breath sounds clear to auscultation       Cardiovascular negative cardio ROS Normal cardiovascular exam Rhythm:Regular Rate:Normal     Neuro/Psych negative psych ROS   GI/Hepatic Neg liver ROS,   Endo/Other  Morbid obesity  Renal/GU   negative genitourinary   Musculoskeletal   Abdominal (+) + obese,   Peds  Hematology   Anesthesia Other Findings   Reproductive/Obstetrics negative OB ROS                             Anesthesia Physical Anesthesia Plan  ASA: III  Anesthesia  Plan: General   Post-op Pain Management:    Induction:   PONV Risk Score and Plan: 4 or greater and Ondansetron, Dexamethasone, Scopolamine patch - Pre-op and Midazolam  Airway Management Planned: Oral ETT  Additional Equipment: None  Intra-op Plan:   Post-operative Plan: Extubation in OR  Informed Consent: I have reviewed the patients History and Physical, chart, labs and discussed the procedure including the risks, benefits and alternatives for the proposed anesthesia with the patient or authorized representative who has indicated his/her understanding and acceptance.     Dental advisory given  Plan Discussed with: CRNA  Anesthesia Plan Comments:         Anesthesia Quick Evaluation  Anesthesia Physical Anesthesia Plan  ASA: III  Anesthesia Plan: General   Post-op Pain Management:    Induction: Intravenous  PONV Risk Score and Plan: 4 or greater and Treatment may vary due to age or medical condition, Ondansetron, Dexamethasone, Midazolam and Droperidol  Airway Management Planned: LMA  Additional Equipment: None  Intra-op Plan:   Post-operative Plan: Extubation in OR  Informed Consent: I have reviewed the patients History and Physical, chart, labs and discussed the procedure including the risks, benefits and alternatives for the proposed anesthesia with the patient or authorized  representative who has indicated his/her understanding and acceptance.     Dental advisory given  Plan Discussed with: CRNA  Anesthesia Plan Comments:        Anesthesia Quick Evaluation

## 2020-06-03 ENCOUNTER — Other Ambulatory Visit: Payer: Self-pay

## 2020-06-03 ENCOUNTER — Ambulatory Visit (HOSPITAL_BASED_OUTPATIENT_CLINIC_OR_DEPARTMENT_OTHER): Payer: 59 | Admitting: Anesthesiology

## 2020-06-03 ENCOUNTER — Encounter (HOSPITAL_BASED_OUTPATIENT_CLINIC_OR_DEPARTMENT_OTHER): Payer: Self-pay | Admitting: Plastic Surgery

## 2020-06-03 ENCOUNTER — Other Ambulatory Visit: Payer: Self-pay | Admitting: Surgical

## 2020-06-03 ENCOUNTER — Encounter (HOSPITAL_BASED_OUTPATIENT_CLINIC_OR_DEPARTMENT_OTHER)
Admission: RE | Disposition: A | Discharge status: Home / Self Care | Payer: Self-pay | Source: Home / Self Care | Attending: Plastic Surgery

## 2020-06-03 ENCOUNTER — Ambulatory Visit (INDEPENDENT_AMBULATORY_CARE_PROVIDER_SITE_OTHER): Payer: 59 | Admitting: Bariatrics

## 2020-06-03 ENCOUNTER — Ambulatory Visit (HOSPITAL_BASED_OUTPATIENT_CLINIC_OR_DEPARTMENT_OTHER)
Admission: RE | Admit: 2020-06-03 | Discharge: 2020-06-03 | Disposition: A | Discharge status: Home / Self Care | Payer: 59 | Attending: Plastic Surgery | Admitting: Plastic Surgery

## 2020-06-03 DIAGNOSIS — Z87442 Personal history of urinary calculi: Secondary | ICD-10-CM | POA: Insufficient documentation

## 2020-06-03 DIAGNOSIS — S21001A Unspecified open wound of right breast, initial encounter: Secondary | ICD-10-CM

## 2020-06-03 DIAGNOSIS — T8189XA Other complications of procedures, not elsewhere classified, initial encounter: Secondary | ICD-10-CM | POA: Diagnosis present

## 2020-06-03 DIAGNOSIS — Z79899 Other long term (current) drug therapy: Secondary | ICD-10-CM | POA: Diagnosis not present

## 2020-06-03 DIAGNOSIS — E7800 Pure hypercholesterolemia, unspecified: Secondary | ICD-10-CM | POA: Diagnosis not present

## 2020-06-03 DIAGNOSIS — K219 Gastro-esophageal reflux disease without esophagitis: Secondary | ICD-10-CM | POA: Insufficient documentation

## 2020-06-03 DIAGNOSIS — Z6841 Body Mass Index (BMI) 40.0 and over, adult: Secondary | ICD-10-CM | POA: Diagnosis not present

## 2020-06-03 DIAGNOSIS — X58XXXA Exposure to other specified factors, initial encounter: Secondary | ICD-10-CM | POA: Diagnosis not present

## 2020-06-03 HISTORY — DX: Personal history of urinary calculi: Z87.442

## 2020-06-03 HISTORY — PX: INCISION AND DRAINAGE OF WOUND: SHX1803

## 2020-06-03 HISTORY — PX: APPLICATION OF A-CELL OF CHEST/ABDOMEN: SHX6302

## 2020-06-03 SURGERY — IRRIGATION AND DEBRIDEMENT WOUND
Anesthesia: General | Site: Breast | Laterality: Bilateral

## 2020-06-03 MED ORDER — TRIAMCINOLONE ACETONIDE 0.025 % EX OINT: 1.0000 "application " | TOPICAL_OINTMENT | Freq: Two times a day (BID) | CUTANEOUS | 0 refills | Status: DC

## 2020-06-03 MED ORDER — PROPOFOL 10 MG/ML IV BOLUS
INTRAVENOUS | Status: AC
  Filled 2020-06-03: qty 20

## 2020-06-03 MED ORDER — CEFAZOLIN SODIUM-DEXTROSE 2-3 GM-%(50ML) IV SOLR
INTRAVENOUS | Status: DC | PRN
  Administered 2020-06-03: 3 g via INTRAVENOUS

## 2020-06-03 MED ORDER — ACETAMINOPHEN 10 MG/ML IV SOLN
1000.0000 mg | Freq: Once | INTRAVENOUS | Status: DC | PRN
  Administered 2020-06-03: 1000 mg via INTRAVENOUS

## 2020-06-03 MED ORDER — SODIUM CHLORIDE 0.9 % IV SOLN: 250.0000 mL | INTRAVENOUS | Status: DC | PRN

## 2020-06-03 MED ORDER — ONDANSETRON HCL 4 MG/2ML IJ SOLN
INTRAMUSCULAR | Status: DC | PRN
  Administered 2020-06-03: 4 mg via INTRAVENOUS

## 2020-06-03 MED ORDER — MIDAZOLAM HCL 2 MG/2ML IJ SOLN
INTRAMUSCULAR | Status: AC
  Filled 2020-06-03: qty 2

## 2020-06-03 MED ORDER — DROPERIDOL 2.5 MG/ML IJ SOLN
INTRAMUSCULAR | Status: AC
  Filled 2020-06-03: qty 2

## 2020-06-03 MED ORDER — PHENYLEPHRINE HCL (PRESSORS) 10 MG/ML IV SOLN
INTRAVENOUS | Status: DC | PRN
  Administered 2020-06-03: 120 ug via INTRAVENOUS

## 2020-06-03 MED ORDER — ONDANSETRON HCL 4 MG/2ML IJ SOLN
INTRAMUSCULAR | Status: AC
  Filled 2020-06-03: qty 2

## 2020-06-03 MED ORDER — OXYCODONE HCL 5 MG/5ML PO SOLN: 5.0000 mg | Freq: Once | ORAL | Status: DC | PRN

## 2020-06-03 MED ORDER — DEXMEDETOMIDINE HCL 200 MCG/2ML IV SOLN
INTRAVENOUS | Status: DC | PRN
  Administered 2020-06-03 (×2): 8 ug via INTRAVENOUS

## 2020-06-03 MED ORDER — ACETAMINOPHEN 325 MG PO TABS: 650.0000 mg | ORAL_TABLET | ORAL | Status: DC | PRN

## 2020-06-03 MED ORDER — CEFAZOLIN SODIUM-DEXTROSE 2-4 GM/100ML-% IV SOLN: 2.0000 g | INTRAVENOUS | Status: DC

## 2020-06-03 MED ORDER — SCOPOLAMINE 1 MG/3DAYS TD PT72
1.0000 | MEDICATED_PATCH | TRANSDERMAL | Status: DC
  Administered 2020-06-03: 1.5 mg via TRANSDERMAL

## 2020-06-03 MED ORDER — FENTANYL CITRATE (PF) 100 MCG/2ML IJ SOLN
INTRAMUSCULAR | Status: AC
  Filled 2020-06-03: qty 2

## 2020-06-03 MED ORDER — ACETAMINOPHEN 325 MG RE SUPP: 650.0000 mg | RECTAL | Status: DC | PRN

## 2020-06-03 MED ORDER — AMISULPRIDE (ANTIEMETIC) 5 MG/2ML IV SOLN: 10.0000 mg | Freq: Once | INTRAVENOUS | Status: DC | PRN

## 2020-06-03 MED ORDER — HYDROMORPHONE HCL 1 MG/ML IJ SOLN: 0.2500 mg | INTRAMUSCULAR | Status: DC | PRN

## 2020-06-03 MED ORDER — PROMETHAZINE HCL 25 MG/ML IJ SOLN: 6.2500 mg | INTRAMUSCULAR | Status: DC | PRN

## 2020-06-03 MED ORDER — DEXAMETHASONE SODIUM PHOSPHATE 10 MG/ML IJ SOLN
INTRAMUSCULAR | Status: DC | PRN
  Administered 2020-06-03: 10 mg via INTRAVENOUS

## 2020-06-03 MED ORDER — LIDOCAINE 2% (20 MG/ML) 5 ML SYRINGE
INTRAMUSCULAR | Status: AC
  Filled 2020-06-03: qty 5

## 2020-06-03 MED ORDER — SODIUM CHLORIDE 0.9% FLUSH: 3.0000 mL | Freq: Two times a day (BID) | INTRAVENOUS | Status: DC

## 2020-06-03 MED ORDER — SODIUM CHLORIDE 0.9% FLUSH: 3.0000 mL | INTRAVENOUS | Status: DC | PRN

## 2020-06-03 MED ORDER — LIDOCAINE-EPINEPHRINE 1 %-1:100000 IJ SOLN
INTRAMUSCULAR | Status: DC | PRN
  Administered 2020-06-03: 17 mL

## 2020-06-03 MED ORDER — LACTATED RINGERS IV SOLN: INTRAVENOUS | Status: DC

## 2020-06-03 MED ORDER — HYDROMORPHONE HCL 1 MG/ML IJ SOLN
INTRAMUSCULAR | Status: AC
  Filled 2020-06-03: qty 0.5

## 2020-06-03 MED ORDER — SCOPOLAMINE 1 MG/3DAYS TD PT72
MEDICATED_PATCH | TRANSDERMAL | Status: AC
  Filled 2020-06-03: qty 1

## 2020-06-03 MED ORDER — OXYCODONE HCL 5 MG PO TABS: 5.0000 mg | ORAL_TABLET | ORAL | Status: DC | PRN

## 2020-06-03 MED ORDER — OXYCODONE HCL 5 MG PO TABS
5.0000 mg | ORAL_TABLET | Freq: Once | ORAL | Status: AC | PRN
  Administered 2020-06-03: 5 mg via ORAL

## 2020-06-03 MED ORDER — OXYCODONE HCL 5 MG/5ML PO SOLN: 5.0000 mg | Freq: Once | ORAL | Status: AC | PRN

## 2020-06-03 MED ORDER — MEPERIDINE HCL 25 MG/ML IJ SOLN: 6.2500 mg | INTRAMUSCULAR | Status: DC | PRN

## 2020-06-03 MED ORDER — HYDROCODONE-ACETAMINOPHEN 5-325 MG PO TABS
1.0000 | ORAL_TABLET | Freq: Four times a day (QID) | ORAL | 0 refills | Status: AC | PRN
Start: 2020-06-03 — End: 2020-06-08

## 2020-06-03 MED ORDER — DEXAMETHASONE SODIUM PHOSPHATE 10 MG/ML IJ SOLN
INTRAMUSCULAR | Status: AC
  Filled 2020-06-03: qty 1

## 2020-06-03 MED ORDER — ONDANSETRON HCL 4 MG/2ML IJ SOLN: 4.0000 mg | Freq: Once | INTRAMUSCULAR | Status: DC | PRN

## 2020-06-03 MED ORDER — HYDROMORPHONE HCL 1 MG/ML IJ SOLN
0.2500 mg | INTRAMUSCULAR | Status: DC | PRN
  Administered 2020-06-03: 0.5 mg via INTRAVENOUS

## 2020-06-03 MED ORDER — CHLORHEXIDINE GLUCONATE CLOTH 2 % EX PADS: 6.0000 | MEDICATED_PAD | Freq: Once | CUTANEOUS | Status: DC

## 2020-06-03 MED ORDER — FENTANYL CITRATE (PF) 100 MCG/2ML IJ SOLN: 25.0000 ug | INTRAMUSCULAR | Status: DC | PRN

## 2020-06-03 MED ORDER — LACTATED RINGERS IV SOLN: INTRAVENOUS | Status: DC | PRN

## 2020-06-03 MED ORDER — DROPERIDOL 2.5 MG/ML IJ SOLN
INTRAMUSCULAR | Status: DC | PRN
  Administered 2020-06-03: .625 mg via INTRAVENOUS

## 2020-06-03 MED ORDER — OXYCODONE HCL 5 MG PO TABS
ORAL_TABLET | ORAL | Status: AC
  Filled 2020-06-03: qty 1

## 2020-06-03 MED ORDER — SODIUM CHLORIDE 0.9 % IV SOLN
INTRAVENOUS | Status: DC | PRN
  Administered 2020-06-03: 500 mL

## 2020-06-03 MED ORDER — OXYCODONE HCL 5 MG PO TABS: 5.0000 mg | ORAL_TABLET | Freq: Once | ORAL | Status: DC | PRN

## 2020-06-03 MED ORDER — CEFAZOLIN SODIUM-DEXTROSE 1-4 GM/50ML-% IV SOLN
INTRAVENOUS | Status: AC
  Filled 2020-06-03: qty 50

## 2020-06-03 MED ORDER — CEFAZOLIN SODIUM-DEXTROSE 2-4 GM/100ML-% IV SOLN
INTRAVENOUS | Status: AC
  Filled 2020-06-03: qty 100

## 2020-06-03 MED ORDER — MIDAZOLAM HCL 2 MG/2ML IJ SOLN
INTRAMUSCULAR | Status: DC | PRN
  Administered 2020-06-03: 2 mg via INTRAVENOUS

## 2020-06-03 MED ORDER — FENTANYL CITRATE (PF) 100 MCG/2ML IJ SOLN
INTRAMUSCULAR | Status: DC | PRN
Start: 2020-06-03 — End: 2020-06-03
  Administered 2020-06-03 (×2): 25 ug via INTRAVENOUS
  Administered 2020-06-03: 100 ug via INTRAVENOUS
  Administered 2020-06-03: 50 ug via INTRAVENOUS

## 2020-06-03 MED ORDER — ACETAMINOPHEN 10 MG/ML IV SOLN
INTRAVENOUS | Status: AC
  Filled 2020-06-03: qty 100

## 2020-06-03 MED ORDER — DOXYCYCLINE HYCLATE 100 MG PO TABS: 100.0000 mg | ORAL_TABLET | Freq: Two times a day (BID) | ORAL | 0 refills | Status: AC

## 2020-06-03 SURGICAL SUPPLY — 77 items
ADH SKN CLS APL DERMABOND .7 (GAUZE/BANDAGES/DRESSINGS)
APL SKNCLS STERI-STRIP NONHPOA (GAUZE/BANDAGES/DRESSINGS)
BAG DECANTER FOR FLEXI CONT (MISCELLANEOUS) ×3 IMPLANT
BENZOIN TINCTURE PRP APPL 2/3 (GAUZE/BANDAGES/DRESSINGS) IMPLANT
BINDER BREAST XXLRG (GAUZE/BANDAGES/DRESSINGS) ×3 IMPLANT
BIOPATCH RED 1 DISK 7.0 (GAUZE/BANDAGES/DRESSINGS) ×2 IMPLANT
BIOPATCH RED 1IN DISK 7.0MM (GAUZE/BANDAGES/DRESSINGS) ×1
BLADE HEX COATED 2.75 (ELECTRODE) IMPLANT
BLADE SURG 10 STRL SS (BLADE) ×9 IMPLANT
BLADE SURG 15 STRL LF DISP TIS (BLADE) IMPLANT
BLADE SURG 15 STRL SS (BLADE)
CANISTER SUCT 1200ML W/VALVE (MISCELLANEOUS) ×3 IMPLANT
CLOSURE WOUND 1/2 X4 (GAUZE/BANDAGES/DRESSINGS)
COVER BACK TABLE 60X90IN (DRAPES) ×3 IMPLANT
COVER MAYO STAND STRL (DRAPES) ×3 IMPLANT
COVER WAND RF STERILE (DRAPES) IMPLANT
DECANTER SPIKE VIAL GLASS SM (MISCELLANEOUS) IMPLANT
DERMABOND ADVANCED (GAUZE/BANDAGES/DRESSINGS)
DERMABOND ADVANCED .7 DNX12 (GAUZE/BANDAGES/DRESSINGS) IMPLANT
DRAIN CHANNEL 19F RND (DRAIN) IMPLANT
DRAIN PENROSE 1/2X12 LTX STRL (WOUND CARE) IMPLANT
DRAPE INCISE IOBAN 66X45 STRL (DRAPES) IMPLANT
DRAPE LAPAROSCOPIC ABDOMINAL (DRAPES) ×3 IMPLANT
DRAPE LAPAROTOMY 100X72 PEDS (DRAPES) IMPLANT
DRSG ADAPTIC 3X8 NADH LF (GAUZE/BANDAGES/DRESSINGS) IMPLANT
DRSG EMULSION OIL 3X3 NADH (GAUZE/BANDAGES/DRESSINGS) IMPLANT
DRSG PAD ABDOMINAL 8X10 ST (GAUZE/BANDAGES/DRESSINGS) IMPLANT
ELECT REM PT RETURN 9FT ADLT (ELECTROSURGICAL) ×3
ELECTRODE REM PT RTRN 9FT ADLT (ELECTROSURGICAL) ×1 IMPLANT
EVACUATOR SILICONE 100CC (DRAIN) ×3 IMPLANT
GAUZE SPONGE 4X4 12PLY STRL (GAUZE/BANDAGES/DRESSINGS) IMPLANT
GAUZE SPONGE 4X4 12PLY STRL LF (GAUZE/BANDAGES/DRESSINGS) IMPLANT
GAUZE XEROFORM 1X8 LF (GAUZE/BANDAGES/DRESSINGS) IMPLANT
GAUZE XEROFORM 5X9 LF (GAUZE/BANDAGES/DRESSINGS) IMPLANT
GLOVE BIO SURGEON STRL SZ 6.5 (GLOVE) ×8 IMPLANT
GLOVE BIO SURGEONS STRL SZ 6.5 (GLOVE) ×4
GOWN STRL REUS W/ TWL LRG LVL3 (GOWN DISPOSABLE) ×4 IMPLANT
GOWN STRL REUS W/TWL LRG LVL3 (GOWN DISPOSABLE) ×12
IV NS IRRIG 3000ML ARTHROMATIC (IV SOLUTION) IMPLANT
MANIFOLD NEPTUNE II (INSTRUMENTS) IMPLANT
MARKER SKIN DUAL TIP RULER LAB (MISCELLANEOUS) ×3 IMPLANT
MICROMATRIX 1000MG (Tissue) ×3 IMPLANT
NEEDLE HYPO 25X1 1.5 SAFETY (NEEDLE) ×3 IMPLANT
NS IRRIG 1000ML POUR BTL (IV SOLUTION) ×3 IMPLANT
PACK BASIN DAY SURGERY FS (CUSTOM PROCEDURE TRAY) ×3 IMPLANT
PENCIL SMOKE EVACUATOR (MISCELLANEOUS) ×3 IMPLANT
PIN SAFETY STERILE (MISCELLANEOUS) ×3 IMPLANT
SHEET MEDIUM DRAPE 40X70 STRL (DRAPES) IMPLANT
SLEEVE SCD COMPRESS KNEE MED (MISCELLANEOUS) ×3 IMPLANT
SOLUTION PARTIC MCRMTRX 1000MG (Tissue) ×1 IMPLANT
SPONGE LAP 18X18 RF (DISPOSABLE) ×9 IMPLANT
STAPLER VISISTAT 35W (STAPLE) IMPLANT
STRIP CLOSURE SKIN 1/2X4 (GAUZE/BANDAGES/DRESSINGS) IMPLANT
SUCTION FRAZIER HANDLE 10FR (MISCELLANEOUS)
SUCTION TUBE FRAZIER 10FR DISP (MISCELLANEOUS) IMPLANT
SURGILUBE 2OZ TUBE FLIPTOP (MISCELLANEOUS) IMPLANT
SUT MNCRL AB 4-0 PS2 18 (SUTURE) ×9 IMPLANT
SUT MON AB 3-0 SH 27 (SUTURE) ×12
SUT MON AB 3-0 SH27 (SUTURE) ×4 IMPLANT
SUT MON AB 5-0 PS2 18 (SUTURE) ×3 IMPLANT
SUT SILK 2 0 SH (SUTURE) ×6 IMPLANT
SUT SILK 3 0 PS 1 (SUTURE) IMPLANT
SUT VIC AB 3-0 FS2 27 (SUTURE) IMPLANT
SUT VIC AB 5-0 PS2 18 (SUTURE) IMPLANT
SUT VICRYL 4-0 PS2 18IN ABS (SUTURE) IMPLANT
SWAB COLLECTION DEVICE MRSA (MISCELLANEOUS) IMPLANT
SWAB CULTURE ESWAB REG 1ML (MISCELLANEOUS) IMPLANT
SYR BULB IRRIG 60ML STRL (SYRINGE) ×3 IMPLANT
SYR CONTROL 10ML LL (SYRINGE) ×3 IMPLANT
TAPE HYPAFIX 6 X30' (GAUZE/BANDAGES/DRESSINGS)
TAPE HYPAFIX 6X30 (GAUZE/BANDAGES/DRESSINGS) IMPLANT
TOWEL GREEN STERILE FF (TOWEL DISPOSABLE) ×3 IMPLANT
TRAY DSU PREP LF (CUSTOM PROCEDURE TRAY) ×3 IMPLANT
TUBE CONNECTING 20'X1/4 (TUBING) ×1
TUBE CONNECTING 20X1/4 (TUBING) ×2 IMPLANT
UNDERPAD 30X36 HEAVY ABSORB (UNDERPADS AND DIAPERS) ×6 IMPLANT
YANKAUER SUCT BULB TIP NO VENT (SUCTIONS) ×3 IMPLANT

## 2020-06-03 NOTE — Anesthesia Procedure Notes (Signed)
Procedure Name: LMA Insertion Performed by: Tavari Loadholt M, CRNA Pre-anesthesia Checklist: Patient identified, Emergency Drugs available, Suction available and Patient being monitored Patient Re-evaluated:Patient Re-evaluated prior to induction Oxygen Delivery Method: Circle system utilized Preoxygenation: Pre-oxygenation with 100% oxygen Induction Type: IV induction Ventilation: Mask ventilation without difficulty LMA: LMA inserted LMA Size: 4.0 Number of attempts: 1 Airway Equipment and Method: Bite block Placement Confirmation: positive ETCO2 Tube secured with: Tape Dental Injury: Teeth and Oropharynx as per pre-operative assessment        

## 2020-06-03 NOTE — Progress Notes (Signed)
Post-op pain meds/abx, steroid for rash

## 2020-06-03 NOTE — H&P (Signed)
Tiffany Young is an 49 y.o. female.   Chief Complaint: bilateral breast wounds HPI: The patient is a 49 yrs old female here for treatment of her bilateral breast wounds. She underwent a massive breast reduction and developed wounds at the vertical limb.    Past Medical History:  Diagnosis Date  . Back pain   . Carpal tunnel syndrome   . GERD (gastroesophageal reflux disease)   . High cholesterol   . History of kidney stones   . Joint pain   . Knee pain, bilateral   . PONV (postoperative nausea and vomiting)   . Pre-diabetes   . Stomach ulcer   . Symptomatic mammary hypertrophy 03/01/2020    Past Surgical History:  Procedure Laterality Date  . BREAST REDUCTION SURGERY Bilateral 05/09/2020   Procedure: BILATERAL BREAST REDUCTION WITH LIPOSUCTION;  Surgeon: Peggye Form, DO;  Location: [facility];  Service: Plastics;  Laterality: Bilateral;  3.5 hours, please  . KNEE SURGERY    . left heel spur      Family History  Problem Relation Age of Onset  . Breast cancer Paternal Aunt   . Breast cancer Cousin   . Diabetes Mother   . Stroke Mother   . Kidney disease Mother   . Cancer Mother   . AAA (abdominal aortic aneurysm) Father    Social History:  reports that she has never smoked. She has never used smokeless tobacco. She reports previous drug use. She reports that she does not drink alcohol.  Allergies: No Known Allergies  Medications Prior to Admission  Medication Sig Dispense Refill  . atorvastatin (LIPITOR) 20 MG tablet Take 20 mg by mouth daily.    . cetirizine (ZYRTEC) 10 MG tablet Take 10 mg by mouth daily.    Marland Kitchen doxycycline (VIBRA-TABS) 100 MG tablet Take 1 tablet (100 mg total) by mouth 2 (two) times daily. 20 tablet 0  . Multiple Vitamins-Minerals (MULTIVITAMIN WITH MINERALS) tablet Take 1 tablet by mouth daily.    Marland Kitchen omeprazole (PRILOSEC) 20 MG capsule Take 20 mg by mouth daily.    . Vitamin D, Ergocalciferol, (DRISDOL) 1.25 MG (50000 UNIT)  CAPS capsule Take 1 capsule (50,000 Units total) by mouth every 7 (seven) days. 4 capsule 0    No results found for this or any previous visit (from the past 48 hour(s)). No results found.  Review of Systems  Constitutional: Positive for activity change.  Eyes: Negative.   Respiratory: Negative.   Cardiovascular: Negative.   Gastrointestinal: Negative.   Genitourinary: Negative.   Skin: Positive for color change and wound.    Blood pressure 135/81, pulse 89, temperature 98.7 F (37.1 C), temperature source Oral, resp. rate 20, height 5\' 5"  (1.651 m), weight 121 kg, last menstrual period 10/02/2019, SpO2 100 %. Physical Exam Vitals and nursing note reviewed.  Constitutional:      Appearance: Normal appearance.  HENT:     Head: Normocephalic and atraumatic.  Cardiovascular:     Rate and Rhythm: Normal rate.     Pulses: Normal pulses.  Pulmonary:     Effort: Pulmonary effort is normal.  Neurological:     General: No focal deficit present.     Mental Status: She is alert and oriented to person, place, and time.  Psychiatric:        Mood and Affect: Mood normal.        Behavior: Behavior normal.      Assessment/Plan Bilateral breast wound The decision was made to  excise the breast wounds and try to close primarily.  Tiffany Young Tiffany Robben, DO 06/03/2020, 1:18 PM

## 2020-06-03 NOTE — Transfer of Care (Signed)
Immediate Anesthesia Transfer of Care Note  Patient: Tiffany Young. [name]  Procedure(s) Performed: Bilateral breast wound excision with closure possible ACell (Bilateral Breast)  Patient Location: PACU  Anesthesia Type:General  Level of Consciousness: awake, alert  and oriented  Airway & Oxygen Therapy: Patient Spontanous Breathing and Patient connected to face mask oxygen  Post-op Assessment: Report given to RN and Post -op Vital signs reviewed and stable  Post vital signs: Reviewed and stable  Last Vitals:  Vitals Value Taken Time  BP    Temp    Pulse    Resp    SpO2      Last Pain:  Vitals:   06/03/20 1250  TempSrc: Oral  PainSc: 0-No pain         Complications: No complications documented.

## 2020-06-03 NOTE — Anesthesia Postprocedure Evaluation (Signed)
Anesthesia Post Note  Patient: Dexter Sauser. Suddreth  Procedure(s) Performed: Irrigation/Debridement bilateral breast wounds (Bilateral Breast) APPLICATION OF A-CELL OF CHEST/ABDOMEN (Bilateral Breast)     Patient location during evaluation: PACU Anesthesia Type: General Level of consciousness: awake and alert Pain management: pain level controlled Vital Signs Assessment: post-procedure vital signs reviewed and stable Respiratory status: spontaneous breathing, nonlabored ventilation and respiratory function stable Cardiovascular status: blood pressure returned to baseline and stable Postop Assessment: no apparent nausea or vomiting Anesthetic complications: no   No complications documented.  Last Vitals:  Vitals:   06/03/20 1516 06/03/20 1530  BP:  127/74  Pulse: 90 90  Resp: 12 17  Temp:    SpO2: 96% 96%    Last Pain:  Vitals:   06/03/20 1530  TempSrc:   PainSc: 5                  Lowella Curb

## 2020-06-03 NOTE — Telephone Encounter (Signed)
Zac from [employer] was in the office today. Advised him that patient is in surgery today.Will not know if her supplies will  Changed.  Supplies have not been sent to her due insurance issue which has been corrected. Will verify with Dr Ulice Bold patients updated supplies and will call [facility] back.

## 2020-06-03 NOTE — Op Note (Signed)
DATE OF OPERATION: 06/03/2020  LOCATION: [facility]  PREOPERATIVE DIAGNOSIS: bilateral breast wounds  POSTOPERATIVE DIAGNOSIS: Same  PROCEDURE:  1. Right Breast:  Excision of 5 x 8 cm wound skin, fat and soft tissue with closure and Acell placement 1 gm 2. Left Breast:  Excision of 1 x 3 cm wound skin, fat and soft tissue with closure.  SURGEON: Albie Bazin Sanger Traivon Morrical, DO  ASSISTANT: Joni Fears, PA  EBL: 5 cc  CONDITION: Stable  COMPLICATIONS: None  INDICATION: The patient, Tiffany Young, is a 49 y.o. female born on [birth year], is here for treatment of bilateral breast wounds after massive breast reduction.   PROCEDURE DETAILS:  The patient was seen prior to surgery and marked.  The IV antibiotics were given. The patient was taken to the operating room and given a general anesthetic. A standard time out was performed and all information was confirmed by those in the room. SCDs were placed.   The breasts were prepped and draped.   Right: Local with epinephrine was injected for intraoperative hemostasis and postoperative pain control.  The area for excision was marked and included  A 5 x 8 cm area of the wound.  The #10 blade was used to excise the 5 x 8 cm wound of nonviable skin, soft tissue and fat.  Hemostasis was achieved with electrocautery.  The area was irrigated with antibiotic solution and saline.  All of the Acell powder was applied.  The deep layers were closed with the 3-0 Monocryl followed by the 4-0 Monocryl.    Left:  Local with epinephrine was injected for intraoperative hemostasis and postoperative pain control.  The area for excision was marked and included  A 1 x 3 cm area of the wound.  The #10 blade was used to excise the 1 x 3 cm wound of nonviable skin, soft tissue and fat.  Hemostasis was achieved with electrocautery.  The area was irrigated with antibiotic solution and saline.  All of the Acell powder was applied.  The deep layers were  closed with the 3-0 Monocryl followed by the 4-0 Monocryl.   The patient was allowed to wake up and taken to recovery room in stable condition at the end of the case. The family was notified at the end of the case.   The advanced practice practitioner (APP) assisted throughout the case.  The APP was essential in retraction and counter traction when needed to make the case progress smoothly.  This retraction and assistance made it possible to see the tissue plans for the procedure.  The assistance was needed for blood control, tissue re-approximation and assisted with closure of the incision site.

## 2020-06-03 NOTE — Anesthesia Preprocedure Evaluation (Signed)
Anesthesia Evaluation  Patient identified by MRN, date of birth, ID band Patient awake    Reviewed: Allergy & Precautions, NPO status , Patient's Chart, lab work & pertinent test results  History of Anesthesia Complications (+) PONV and history of anesthetic complications  Airway Mallampati: II       Dental no notable dental hx. (+) Teeth Intact   Pulmonary neg pulmonary ROS,    Pulmonary exam normal breath sounds clear to auscultation       Cardiovascular negative cardio ROS Normal cardiovascular exam Rhythm:Regular Rate:Normal     Neuro/Psych negative psych ROS   GI/Hepatic Neg liver ROS, GERD  ,  Endo/Other  Morbid obesity  Renal/GU   negative genitourinary   Musculoskeletal   Abdominal (+) + obese,   Peds  Hematology   Anesthesia Other Findings   Reproductive/Obstetrics negative OB ROS                             Anesthesia Physical  Anesthesia Plan  ASA: III  Anesthesia Plan: General   Post-op Pain Management:    Induction: Intravenous  PONV Risk Score and Plan: 4 or greater and Ondansetron, Dexamethasone, Midazolam and Droperidol  Airway Management Planned: LMA  Additional Equipment: None  Intra-op Plan:   Post-operative Plan: Extubation in OR  Informed Consent: I have reviewed the patients History and Physical, chart, labs and discussed the procedure including the risks, benefits and alternatives for the proposed anesthesia with the patient or authorized representative who has indicated his/her understanding and acceptance.     Dental advisory given  Plan Discussed with: CRNA  Anesthesia Plan Comments:         Anesthesia Quick Evaluation

## 2020-06-03 NOTE — Discharge Instructions (Signed)
Next dose of Tylenol at 9:30 PM   INSTRUCTIONS FOR AFTER SURGERY   You will likely have some questions about what to expect following your operation.  The following information will help you and your family understand what to expect when you are discharged from the hospital.  Following these guidelines will help ensure a smooth recovery and reduce risks of complications.  Postoperative instructions include information on: diet, wound care, medications and physical activity.  AFTER SURGERY Expect to go home after the procedure.  In some cases, you may need to spend one night in the hospital for observation.  DIET This surgery does not require a specific diet.  However, I have to mention that the healthier you eat the better your body can start healing. It is important to increasing your protein intake.  This means limiting the foods with added sugar.  Focus on fruits and vegetables and some meat. It is very important to drink water after your surgery.  If your urine is bright yellow, then it is concentrated, and you need to drink more water.  As a general rule after surgery, you should have 8 ounces of water every hour while awake.  If you find you are persistently nauseated or unable to take in liquids let us know.  NO TOBACCO USE or EXPOSURE.  This will slow your healing process and increase the risk of a wound.  WOUND CARE If you don't have a drain: You can shower the day after surgery.  Use fragrance free soap.  Dial, Dove, [place] and Cetaphil are usually mild on the skin.  If you have a drain: Clean with baby wipes until the drain is removed.   If you have steri-strips / tape directly attached to your skin leave them in place. It is OK to get these wet.  No baths, pools or hot tubs for two weeks. We close your incision to leave the smallest and best-looking scar. No ointment or creams on your incisions until given the go ahead.  Especially not Neosporin (Too many skin reactions with this one).  A  few weeks after surgery you can use Mederma and start massaging the scar. We ask you to wear your binder or sports bra for the first 6 weeks around the clock, including while sleeping. This provides added comfort and helps reduce the fluid accumulation at the surgery site.  ACTIVITY No heavy lifting until cleared by the doctor.  It is OK to walk and climb stairs. In fact, moving your legs is very important to decrease your risk of a blood clot.  It will also help keep you from getting deconditioned.  Every 1 to 2 hours get up and walk for 5 minutes. This will help with a quicker recovery back to normal.  Let pain be your guide so you don't do too much.  NO, you cannot do the spring cleaning and don't plan on taking care of anyone else.  This is your time for TLC.   WORK Everyone returns to work at different times. As a rough guide, most people take at least 1 - 2 weeks off prior to returning to work. If you need documentation for your job, bring the forms to your postoperative follow up visit.  DRIVING Arrange for someone to bring you home from the hospital.  You may be able to drive a few days after surgery but not while taking any narcotics or valium.  BOWEL MOVEMENTS Constipation can occur after anesthesia and while taking pain  medication.  It is important to stay ahead for your comfort.  We recommend taking Milk of Magnesia (2 tablespoons; twice a day) while taking the pain pills.  SEROMA This is fluid your body tried to put in the surgical site.  This is normal but if it creates excessive pain and swelling let us know.  It usually decreases in a few weeks.  MEDICATIONS and PAIN CONTROL At your preoperative visit for you history and physical you were given the following medications: 1. An antibiotic: Start this medication when you get home and take according to the instructions on the bottle. 2. Zofran 4 mg:  This is to treat nausea and vomiting.  You can take this every 6 hours as needed  and only if needed. 3. Norco (hydrocodone/acetaminophen) 5/325 mg:  This is only to be used after you have taken the motrin or the tylenol. Every 8 hours as needed. Over the counter Medication to take: 4. Ibuprofen (Motrin) 600 mg:  Take this every 6 hours.  If you have additional pain then take 500 mg of the tylenol.  Only take the Norco after you have tried these two. 5. Miralax or stool softener of choice: Take this according to the bottle if you take the Norco.  WHEN TO CALL Call your surgeon's office if any of the following occur: . Fever 101 degrees F or greater . Excessive bleeding or fluid from the incision site. . Pain that increases over time without aid from the medications . Redness, warmth, or pus draining from incision sites . Persistent nausea or inability to take in liquids . Severe misshapen area that underwent the operation.  [facility]  What is the benefit of having a drain?  During surgery your tissue layers are separated.  This raw surface stimulates your body to fill the space with serous fluid.  This is normal but you don't want that fluid to collect and prevent healing.  A fluid collection can also become infected.  The Jackson-Pratt (JP) drain is used to eliminate this collection of fluid and allow the tissue to heal together.    Jackson-Pratt (JP) bulb    How to care for your drainage and suction unit at home Your drainage catheter will be connected to a collection device. The vacuum caused when the device is compressed allows drainage to collect in the device.    Tiffany Young your hands with soap and water before and after touching the system. . Empty the JP drain every 12 hours once you get home from your procedure. . Record the fluid amount on the record sheet included. . Start with stripping the drain tube to push the clots or excess fluid to the bulb.  Do this by pinching the tube with one hand near your skin.  Then with the other hand  squeeze the tubing and work it toward the bulb.  This should be done several times a day.  This may collapse the tube which will correct on its own.   . Use a safety pin to attach your collection device to your clothing so there is no tension on the insertion site.   . If you have drainage at the skin insertion site, you can apply a gauze dressing and secure it with tape. . If the drain falls out, apply a gauze dressing over the drain insertion site and secure with tape.   To empty the collection device:   . Release the stopper on the top of the  collection unit (bulb).  Tiffany Young. Pour contents into a measuring container such as a plastic medicine cup.  . Record the day and amount of drainage on the attached sheet. . This should be done at least twice a day.    To compress the Jackson-Pratt Bulb:  . Release the stopper at the top of the bulb. Marland Kitchen. Squeeze the bulb tightly in your fist, squeezing air out of the bulb.  . Replace the stopper while the bulb is compressed.  . Be careful not to spill the contents when squeezing the bulb. . The drainage will start bright red and turn to pink and then yellow with time. . IMPORTANT: If the bulb is not squeezed before adding the stopper it will not draw out the fluid.  Care for the JP drain site and your skin daily:  . You may shower three days after surgery. . Secure the drain to a ribbon or cloth around your waist while showering so it does not pull out while showering. . Be sure your hands are cleaned with soap and water. . Use a clean wet cotton swab to clean the skin around the drain site.  . Use another cotton swab to place Vaseline or antibiotic ointment on the skin around the drain.     Contact your physician if any of the following occur:  Marland Kitchen. The fluid in the bulb becomes cloudy. . Your temperature is greater than 101.4.  Marland Kitchen. The incision opens. . If you have drainage at the skin insertion site, you can apply a gauze dressing and secure it with  tape. . If the drain falls out, apply a gauze dressing over the drain insertion site and secure with tape.  . You will usually have more drainage when you are active than while you rest or are asleep. If the drainage increases significantly or is bloody call the physician                             Bring this record with you to each office visit Date  Drainage Volume  Date   Drainage volume                                                                                                                                                                                            Post Anesthesia Home Care Instructions  Activity: Get plenty of rest for the remainder of the day. A responsible individual must stay with you for 24 hours following the procedure.  For the next 24 hours, DO NOT: -Drive a car Environmental consultant-Operate machinery -Drink  alcoholic beverages -Take any medication unless instructed by your physician -Make any legal decisions or sign important papers.  Meals: Start with liquid foods such as gelatin or soup. Progress to regular foods as tolerated. Avoid greasy, spicy, heavy foods. If nausea and/or vomiting occur, drink only clear liquids until the nausea and/or vomiting subsides. Call your physician if vomiting continues.  Special Instructions/Symptoms: Your throat may feel dry or sore from the anesthesia or the breathing tube placed in your throat during surgery. If this causes discomfort, gargle with warm salt water. The discomfort should disappear within 24 hours.  If you had a scopolamine patch placed behind your ear for the management of post- operative nausea and/or vomiting:  1. The medication in the patch is effective for 72 hours, after which it should be removed.  Wrap patch in a tissue and discard in the trash. Wash hands thoroughly with soap and water. 2. You may remove the patch earlier than 72 hours if you experience unpleasant side effects which may  include dry mouth, dizziness or visual disturbances. 3. Avoid touching the patch. Wash your hands with soap and water after contact with the patch.

## 2020-06-04 ENCOUNTER — Telehealth: Payer: Self-pay

## 2020-06-04 ENCOUNTER — Encounter (HOSPITAL_BASED_OUTPATIENT_CLINIC_OR_DEPARTMENT_OTHER): Payer: Self-pay | Admitting: Plastic Surgery

## 2020-06-04 NOTE — Telephone Encounter (Signed)
Called Tiffany Young with [employer] and advised him to hold off on supplies since patient had insurance yesterday.

## 2020-06-08 ENCOUNTER — Encounter: Payer: Self-pay | Admitting: Plastic Surgery

## 2020-06-10 NOTE — Progress Notes (Signed)
Patient is a 49 year old female here for follow-up after undergoing right breast wound excision, placement of ACell, and closure as well as left breast wound excision with closure on 06/03/2020 with Dr. Ulice Bold.  She had a bilateral breast reduction with Dr. Ulice Bold on 05/09/2020.  ~ 1 week PO (~5 weeks PO BR) Patient reports she is feeling well today.  She had a small spot open along the left vertical limb this weekend and drain fluid. Wound is now closed.  Bilateral dressings in place.  Visible incisions are all intact; clean and dry.  No signs of infection, redness, seroma/hematoma.  Right drain is in place.  Almost no drainage for the last few days.  Drain was removed today.  Patient denies fever/chills, nausea/vomiting. +BM.  Follow-up next week for dressing removal.  May shower; avoid scrubbing incisions or direct water spray.  Avoid heavy lifting.  Continue to wear compression garment 24/7.  Apply vaseline and gauze/bandage ro drain site until closed. Call office with any questions/concerns.  The 21st Century Cures Act was signed into law in 2016 which includes the topic of electronic health records.  This provides immediate access to information in MyChart.  This includes consultation notes, operative notes, office notes, lab results and pathology reports.  If you have any questions about what you read please let us know at your next visit or call us at the office.  We are right here with you.

## 2020-06-11 ENCOUNTER — Ambulatory Visit (INDEPENDENT_AMBULATORY_CARE_PROVIDER_SITE_OTHER): Payer: 59 | Admitting: Plastic Surgery

## 2020-06-11 ENCOUNTER — Encounter: Payer: Self-pay | Admitting: Plastic Surgery

## 2020-06-11 ENCOUNTER — Other Ambulatory Visit: Payer: Self-pay

## 2020-06-11 VITALS — BP 139/99 | HR 88 | Temp 98.1°F

## 2020-06-11 DIAGNOSIS — Z9889 Other specified postprocedural states: Secondary | ICD-10-CM

## 2020-06-16 NOTE — Progress Notes (Signed)
Patient is a 49 year old female here for follow-up after undergoing right breast wound excision, placement of ACell, and closure as well as left breast wound excision with closure on 06/03/2020 with Dr. Ulice Bold.  She had a bilateral breast reduction with Dr. Ulice Bold on 05/09/2020.  ~ 2 weeks PO (~6 weeks Breast Reduction PO) The patient gave consent to have this visit done by telemedicine / virtual visit.  This is also consent for access the chart and treat the patient via this visit. The patient is located at home.  I, the provider, am at the office.  We spent 20 minutes together for the visit.  Patient has been exposed to COVID. Her 23 yr old nephew is in the hospital with COVID.  Patient reports she is doing very well.  Reports the remaining bandages have come off in the last few days.  Incisions are intact.  She reports no redness and minimal drainage.  She has a small area on the left breast approximately 2 inches below the incision that is raw and appears that a small layer of skin came off.  She has been putting Vaseline and gauze over this.  She denies fever/chills, nausea/vomiting, chest pain, and shortness of breath.  Continue to place Vaseline and gauze or Band-Aid over the raw area on the left breast.  Continue to wear compression garment 24/7.  May continue to shower avoid direct scrubbing of the incisions.  Continue to avoid heavy lifting (less than 20 pounds).  Follow-up in 3 weeks.  Call office with any questions/concerns or if condition changes.  The 21st Century Cures Act was signed into law in 2016 which includes the topic of electronic health records.  This provides immediate access to information in MyChart.  This includes consultation notes, operative notes, office notes, lab results and pathology reports.  If you have any questions about what you read please let us know at your next visit or call us at the office.  We are right here with you.

## 2020-06-17 ENCOUNTER — Ambulatory Visit: Payer: 59 | Admitting: Surgical

## 2020-06-18 ENCOUNTER — Telehealth (INDEPENDENT_AMBULATORY_CARE_PROVIDER_SITE_OTHER): Payer: 59 | Admitting: Plastic Surgery

## 2020-06-18 ENCOUNTER — Encounter: Payer: Self-pay | Admitting: Plastic Surgery

## 2020-06-18 ENCOUNTER — Other Ambulatory Visit: Payer: Self-pay

## 2020-06-18 DIAGNOSIS — Z9889 Other specified postprocedural states: Secondary | ICD-10-CM

## 2020-06-18 DIAGNOSIS — S21001D Unspecified open wound of right breast, subsequent encounter: Secondary | ICD-10-CM

## 2020-06-18 DIAGNOSIS — S21002D Unspecified open wound of left breast, subsequent encounter: Secondary | ICD-10-CM

## 2020-06-20 ENCOUNTER — Telehealth: Payer: Self-pay

## 2020-06-20 NOTE — Telephone Encounter (Signed)
Spoke with [place] with [employer], verified to close out order from 05/22/20. Patient had surgery on 06/03/20

## 2020-07-08 NOTE — Progress Notes (Deleted)
Patient is a 48-yr-old female here for follow-up after undergoing right breast wound excision, placement of Acell, and closure as well as left breast wound excision with closure on 06/03/20 with Dr. Ulice Bold. She had a bilateral breast reduction with Dr. Ulice Bold on 05/09/20.  ~ 5 weeks PO ( ~ 9 weeks Breast Reduction PO)

## 2020-07-11 ENCOUNTER — Other Ambulatory Visit: Payer: Self-pay

## 2020-07-11 ENCOUNTER — Encounter: Payer: Self-pay | Admitting: Plastic Surgery

## 2020-07-11 ENCOUNTER — Ambulatory Visit (INDEPENDENT_AMBULATORY_CARE_PROVIDER_SITE_OTHER): Payer: 59 | Admitting: Plastic Surgery

## 2020-07-11 ENCOUNTER — Ambulatory Visit: Payer: 59 | Admitting: Plastic Surgery

## 2020-07-11 VITALS — BP 129/86 | HR 89 | Temp 98.2°F

## 2020-07-11 DIAGNOSIS — N62 Hypertrophy of breast: Secondary | ICD-10-CM

## 2020-07-11 NOTE — Progress Notes (Signed)
Patient is here about 2 months postop from bilateral breast reduction.  She is very happy and feels like everything is going well.  She did have some wounds that were excised and reclosed and she feels like everything is healed up at this point.  On exam she is totally healed and epithelialized all wounds.  There is no subcutaneous fluid that is obvious.  Nipple areolar complexes are viable.  I had a discussion with the patient about how to proceed going forward.  I offered another follow-up appointment patient prefers to call us if she has any issues.  I think she is far enough along at this point that she will go on to do well.  All of her questions were answered and we will plan to see her again on an as-needed basis.

## 2020-07-31 NOTE — H&P (Signed)
Reason for visit:  Complaints of carpal tunnel syndrome, worse on the left.    HPI: She has had off and on carpal tunnel syndrome since her pregnancy approximately 20 years ago.  She has been working on weight loss and has done a great job with that.  She gets woken up at night and has significant pain in it.  Numbness is almost always there, but does wax and wane.  She has tried braces for this.     Past Medical History:  Diagnosis Date  . Back pain   . Carpal tunnel syndrome   . GERD (gastroesophageal reflux disease)   . High cholesterol   . History of kidney stones   . Joint pain   . Knee pain, bilateral   . PONV (postoperative nausea and vomiting)   . Pre-diabetes   . Stomach ulcer   . Symptomatic mammary hypertrophy 03/01/2020     Past Surgical History:  Procedure Laterality Date  . APPLICATION OF A-CELL OF CHEST/ABDOMEN Bilateral 06/03/2020   Procedure: APPLICATION OF A-CELL OF CHEST/ABDOMEN;  Surgeon: Peggye Form, DO;  Location: [facility];  Service: Plastics;  Laterality: Bilateral;  . BREAST REDUCTION SURGERY Bilateral 05/09/2020   Procedure: BILATERAL BREAST REDUCTION WITH LIPOSUCTION;  Surgeon: Peggye Form, DO;  Location: [facility];  Service: Plastics;  Laterality: Bilateral;  3.5 hours, please  . INCISION AND DRAINAGE OF WOUND Bilateral 06/03/2020   Procedure: Irrigation/Debridement bilateral breast wounds;  Surgeon: Peggye Form, DO;  Location: [facility];  Service: Plastics;  Laterality: Bilateral;  1 hour  . KNEE SURGERY    . left heel spur       Family History  Problem Relation Age of Onset  . Breast cancer Paternal Aunt   . Breast cancer Cousin   . Diabetes Mother   . Stroke Mother   . Kidney disease Mother   . Cancer Mother   . AAA (abdominal aortic aneurysm) Father      Current Medications No current facility-administered medications for this encounter.   Marland Kitchen atorvastatin (LIPITOR) 20 MG  tablet  . cetirizine (ZYRTEC) 10 MG tablet  . Multiple Vitamins-Minerals (MULTIVITAMIN WITH MINERALS) tablet  . omeprazole (PRILOSEC) 20 MG capsule  . triamcinolone (KENALOG) 0.025 % ointment     Allergies Patient has no known allergies.  Social History  reports that she has never smoked. She has never used smokeless tobacco. She reports previous drug use. She reports that she does not drink alcohol.    Physical Exam General: Well appearing, in NAD Mental status: Alert and Oriented x3 Lungs: CTA b/l anterior and posterior without crackles or wheeze Heart: RRR, no m/g/r appreciated Abdomen: +BS, soft, nt, nd, no masses, hernias, or organomegaly appreciated Neurological: Speech Clear and organized, no gross focal findings or movement disorder appreciated Musculoskeletal: no gross joint deformity or swelling.  Observed gait normal Extremities: .  She has a positive Phalen's.  Some decreased sensation in her median nerve distribution.  Other nerves are preserved.   Warm and well perfused w/o edema Skin: Warm and dry    Assessment/Plan Bilateral carpal tunnel syndrome, left worse than right.  I discussed risks and benefits of endoscopic left carpal tunnel syndrome.  She would like to go forward with that.

## 2020-08-22 ENCOUNTER — Encounter (HOSPITAL_BASED_OUTPATIENT_CLINIC_OR_DEPARTMENT_OTHER): Payer: Self-pay | Admitting: Orthopedic Surgery

## 2020-08-22 ENCOUNTER — Other Ambulatory Visit: Payer: Self-pay

## 2020-08-22 NOTE — Progress Notes (Addendum)
Spoke w/ via phone for pre-op interview---pt Lab needs dos----  Urine poct, has lab appt 08-23-2020 1000 am for cbc with dif, bmet             Lab results------ekg 04-15-2020 COVID test ------08-23-2020 1130 Arrive at -------645 am 08-27-2020 NPO after MN NO Solid Food.  Clear liquids from MN until---545 am then npo Medications to take morning of surgery -----certrizine, atorvastatin Diabetic medication -----n/a Patient Special Instructions -----none Pre-Op special Istructions -----none Patient verbalized understanding of instructions that were given at this phone interview. Patient denies shortness of breath, chest pain, fever, cough at this phone interview.

## 2020-08-23 ENCOUNTER — Other Ambulatory Visit (HOSPITAL_COMMUNITY)
Admission: RE | Admit: 2020-08-23 | Discharge: 2020-08-23 | Disposition: A | Discharge status: Home / Self Care | Payer: 59 | Source: Ambulatory Visit | Attending: Orthopedic Surgery | Admitting: Orthopedic Surgery

## 2020-08-23 ENCOUNTER — Encounter (HOSPITAL_COMMUNITY)
Admission: RE | Admit: 2020-08-23 | Discharge: 2020-08-23 | Disposition: A | Discharge status: Home / Self Care | Payer: 59 | Source: Ambulatory Visit | Attending: Orthopedic Surgery | Admitting: Orthopedic Surgery

## 2020-08-23 DIAGNOSIS — Z20822 Contact with and (suspected) exposure to covid-19: Secondary | ICD-10-CM | POA: Diagnosis not present

## 2020-08-23 DIAGNOSIS — Z01812 Encounter for preprocedural laboratory examination: Secondary | ICD-10-CM | POA: Insufficient documentation

## 2020-08-23 LAB — CBC WITH DIFFERENTIAL/PLATELET
Abs Immature Granulocytes: 0.01 10*3/uL (ref 0.00–0.07)
Basophils Absolute: 0 10*3/uL (ref 0.0–0.1)
Basophils Relative: 1 %
Eosinophils Absolute: 0.1 10*3/uL (ref 0.0–0.5)
Eosinophils Relative: 2 %
HCT: 42.1 % (ref 36.0–46.0)
Hemoglobin: 13.7 g/dL (ref 12.0–15.0)
Immature Granulocytes: 0 %
Lymphocytes Relative: 45 %
Lymphs Abs: 2 10*3/uL (ref 0.7–4.0)
MCH: 30.5 pg (ref 26.0–34.0)
MCHC: 32.5 g/dL (ref 30.0–36.0)
MCV: 93.8 fL (ref 80.0–100.0)
Monocytes Absolute: 0.4 10*3/uL (ref 0.1–1.0)
Monocytes Relative: 8 %
Neutro Abs: 2 10*3/uL (ref 1.7–7.7)
Neutrophils Relative %: 44 %
Platelets: 258 10*3/uL (ref 150–400)
RBC: 4.49 MIL/uL (ref 3.87–5.11)
RDW: 13.6 % (ref 11.5–15.5)
WBC: 4.5 10*3/uL (ref 4.0–10.5)
nRBC: 0 % (ref 0.0–0.2)

## 2020-08-23 LAB — BASIC METABOLIC PANEL
Anion gap: 11 (ref 5–15)
BUN: 16 mg/dL (ref 6–20)
CO2: 25 mmol/L (ref 22–32)
Calcium: 9.3 mg/dL (ref 8.9–10.3)
Chloride: 104 mmol/L (ref 98–111)
Creatinine, Ser: 0.8 mg/dL (ref 0.44–1.00)
GFR, Estimated: >60 mL/min (ref >60)
Glucose, Bld: 91 mg/dL (ref 70–99)
Potassium: 3.9 mmol/L (ref 3.5–5.1)
Sodium: 140 mmol/L (ref 135–145)

## 2020-08-23 LAB — SARS CORONAVIRUS 2 (TAT 6-24 HRS): SARS Coronavirus 2: NEGATIVE

## 2020-08-26 MED ORDER — DEXTROSE 5 % IV SOLN
3.0000 g | INTRAVENOUS | Status: AC
  Administered 2020-08-27: 3 g via INTRAVENOUS
  Filled 2020-08-26: qty 3

## 2020-08-26 NOTE — Anesthesia Preprocedure Evaluation (Addendum)
Anesthesia Evaluation  Patient identified by MRN, date of birth, ID band Patient awake    Reviewed: Allergy & Precautions, Patient's Chart, lab work & pertinent test results  History of Anesthesia Complications (+) PONV and history of anesthetic complications  Airway Mallampati: II  TM Distance: >3 FB Neck ROM: Full    Dental no notable dental hx.    Pulmonary neg pulmonary ROS,    Pulmonary exam normal breath sounds clear to auscultation       Cardiovascular negative cardio ROS Normal cardiovascular exam Rhythm:Regular Rate:Normal     Neuro/Psych  Neuromuscular disease negative psych ROS   GI/Hepatic Neg liver ROS, PUD, GERD (diet-related)  ,  Endo/Other  Morbid obesity  Renal/GU negative Renal ROS     Musculoskeletal negative musculoskeletal ROS (+)   Abdominal (+) + obese,   Peds  Hematology HLD   Anesthesia Other Findings LEFT CARPAL TUNNEL SYNDROME  Reproductive/Obstetrics                            Anesthesia Physical Anesthesia Plan  ASA: III  Anesthesia Plan: General   Post-op Pain Management:    Induction: Intravenous  PONV Risk Score and Plan: 4 or greater and Midazolam, Dexamethasone, Ondansetron, Treatment may vary due to age or medical condition, Scopolamine patch - Pre-op and Propofol infusion  Airway Management Planned: LMA  Additional Equipment:   Intra-op Plan:   Post-operative Plan: Extubation in OR  Informed Consent: I have reviewed the patients History and Physical, chart, labs and discussed the procedure including the risks, benefits and alternatives for the proposed anesthesia with the patient or authorized representative who has indicated his/her understanding and acceptance.     Dental advisory given  Plan Discussed with: CRNA  Anesthesia Plan Comments:       Anesthesia Quick Evaluation

## 2020-08-27 ENCOUNTER — Other Ambulatory Visit: Payer: Self-pay

## 2020-08-27 ENCOUNTER — Encounter (HOSPITAL_BASED_OUTPATIENT_CLINIC_OR_DEPARTMENT_OTHER)
Admission: RE | Disposition: A | Discharge status: Home / Self Care | Payer: Self-pay | Source: Home / Self Care | Attending: Orthopedic Surgery

## 2020-08-27 ENCOUNTER — Ambulatory Visit (HOSPITAL_BASED_OUTPATIENT_CLINIC_OR_DEPARTMENT_OTHER): Payer: 59 | Admitting: Anesthesiology

## 2020-08-27 ENCOUNTER — Encounter (HOSPITAL_BASED_OUTPATIENT_CLINIC_OR_DEPARTMENT_OTHER): Payer: Self-pay | Admitting: Orthopedic Surgery

## 2020-08-27 ENCOUNTER — Ambulatory Visit (HOSPITAL_BASED_OUTPATIENT_CLINIC_OR_DEPARTMENT_OTHER)
Admission: RE | Admit: 2020-08-27 | Discharge: 2020-08-27 | Disposition: A | Discharge status: Home / Self Care | Payer: 59 | Attending: Orthopedic Surgery | Admitting: Orthopedic Surgery

## 2020-08-27 DIAGNOSIS — G5602 Carpal tunnel syndrome, left upper limb: Secondary | ICD-10-CM | POA: Insufficient documentation

## 2020-08-27 HISTORY — PX: CARPAL TUNNEL RELEASE: SHX101

## 2020-08-27 LAB — POCT PREGNANCY, URINE: Preg Test, Ur: NEGATIVE

## 2020-08-27 SURGERY — RELEASE, CARPAL TUNNEL, ENDOSCOPIC
Anesthesia: General | Site: Hand | Laterality: Left

## 2020-08-27 MED ORDER — ARTIFICIAL TEARS OPHTHALMIC OINT
TOPICAL_OINTMENT | OPHTHALMIC | Status: AC
  Filled 2020-08-27: qty 3.5

## 2020-08-27 MED ORDER — CEFAZOLIN SODIUM-DEXTROSE 2-4 GM/100ML-% IV SOLN: 2.0000 g | Freq: Four times a day (QID) | INTRAVENOUS | Status: DC

## 2020-08-27 MED ORDER — FENTANYL CITRATE (PF) 100 MCG/2ML IJ SOLN: 25.0000 ug | INTRAMUSCULAR | Status: DC | PRN

## 2020-08-27 MED ORDER — LACTATED RINGERS IV SOLN: INTRAVENOUS | Status: DC

## 2020-08-27 MED ORDER — MIDAZOLAM HCL 2 MG/2ML IJ SOLN
INTRAMUSCULAR | Status: AC
  Filled 2020-08-27: qty 2

## 2020-08-27 MED ORDER — DEXAMETHASONE SODIUM PHOSPHATE 10 MG/ML IJ SOLN
INTRAMUSCULAR | Status: AC
  Filled 2020-08-27: qty 1

## 2020-08-27 MED ORDER — ONDANSETRON HCL 4 MG/2ML IJ SOLN
INTRAMUSCULAR | Status: AC
  Filled 2020-08-27: qty 4

## 2020-08-27 MED ORDER — DEXAMETHASONE SODIUM PHOSPHATE 10 MG/ML IJ SOLN
INTRAMUSCULAR | Status: DC | PRN
  Administered 2020-08-27: 10 mg via INTRAVENOUS

## 2020-08-27 MED ORDER — KETOROLAC TROMETHAMINE 30 MG/ML IJ SOLN
30.0000 mg | Freq: Once | INTRAMUSCULAR | Status: DC | PRN
Start: 2020-08-27 — End: 2020-08-27

## 2020-08-27 MED ORDER — PROPOFOL 10 MG/ML IV BOLUS
INTRAVENOUS | Status: AC
  Filled 2020-08-27: qty 40

## 2020-08-27 MED ORDER — ACETAMINOPHEN 500 MG PO TABS
1000.0000 mg | ORAL_TABLET | Freq: Once | ORAL | Status: AC
  Administered 2020-08-27: 1000 mg via ORAL

## 2020-08-27 MED ORDER — SENNOSIDES-DOCUSATE SODIUM 8.6-50 MG PO TABS: 1.0000 | ORAL_TABLET | Freq: Every evening | ORAL | Status: DC | PRN

## 2020-08-27 MED ORDER — KETOROLAC TROMETHAMINE 30 MG/ML IJ SOLN
INTRAMUSCULAR | Status: DC | PRN
  Administered 2020-08-27: 30 mg via INTRAVENOUS

## 2020-08-27 MED ORDER — METOCLOPRAMIDE HCL 5 MG/ML IJ SOLN
5.0000 mg | Freq: Three times a day (TID) | INTRAMUSCULAR | Status: DC | PRN
  Administered 2020-08-27: 10 mg via INTRAVENOUS

## 2020-08-27 MED ORDER — FENTANYL CITRATE (PF) 100 MCG/2ML IJ SOLN
INTRAMUSCULAR | Status: DC | PRN
  Administered 2020-08-27 (×2): 50 ug via INTRAVENOUS

## 2020-08-27 MED ORDER — METOCLOPRAMIDE HCL 5 MG/ML IJ SOLN
INTRAMUSCULAR | Status: AC
  Filled 2020-08-27: qty 2

## 2020-08-27 MED ORDER — HYDROCODONE-ACETAMINOPHEN 5-325 MG PO TABS: 1.0000 | ORAL_TABLET | ORAL | Status: DC | PRN

## 2020-08-27 MED ORDER — MORPHINE SULFATE (PF) 4 MG/ML IV SOLN: 0.5000 mg | INTRAVENOUS | Status: DC | PRN

## 2020-08-27 MED ORDER — FENTANYL CITRATE (PF) 100 MCG/2ML IJ SOLN
INTRAMUSCULAR | Status: AC
  Filled 2020-08-27: qty 2

## 2020-08-27 MED ORDER — PROPOFOL 10 MG/ML IV BOLUS
INTRAVENOUS | Status: DC | PRN
  Administered 2020-08-27: 20 mg via INTRAVENOUS
  Administered 2020-08-27: 200 mg via INTRAVENOUS
  Administered 2020-08-27: 20 mg via INTRAVENOUS
  Administered 2020-08-27: 50 mg via INTRAVENOUS

## 2020-08-27 MED ORDER — KETOROLAC TROMETHAMINE 30 MG/ML IJ SOLN
INTRAMUSCULAR | Status: AC
  Filled 2020-08-27: qty 1

## 2020-08-27 MED ORDER — HYDROCODONE-ACETAMINOPHEN 5-325 MG PO TABS: 1.0000 | ORAL_TABLET | ORAL | 0 refills | Status: AC | PRN

## 2020-08-27 MED ORDER — METOCLOPRAMIDE HCL 5 MG PO TABS: 5.0000 mg | ORAL_TABLET | Freq: Three times a day (TID) | ORAL | Status: DC | PRN

## 2020-08-27 MED ORDER — SCOPOLAMINE 1 MG/3DAYS TD PT72
MEDICATED_PATCH | TRANSDERMAL | Status: DC | PRN
  Administered 2020-08-27: 1 via TRANSDERMAL

## 2020-08-27 MED ORDER — LIDOCAINE 2% (20 MG/ML) 5 ML SYRINGE
INTRAMUSCULAR | Status: DC | PRN
  Administered 2020-08-27: 100 mg via INTRAVENOUS

## 2020-08-27 MED ORDER — BISACODYL 10 MG RE SUPP: 10.0000 mg | Freq: Every day | RECTAL | Status: DC | PRN

## 2020-08-27 MED ORDER — ACETAMINOPHEN 500 MG PO TABS
ORAL_TABLET | ORAL | Status: AC
  Filled 2020-08-27: qty 2

## 2020-08-27 MED ORDER — ONDANSETRON HCL 4 MG PO TABS: 4.0000 mg | ORAL_TABLET | Freq: Four times a day (QID) | ORAL | Status: DC | PRN

## 2020-08-27 MED ORDER — OXYCODONE HCL 5 MG PO TABS: 5.0000 mg | ORAL_TABLET | Freq: Once | ORAL | Status: DC | PRN

## 2020-08-27 MED ORDER — BUPIVACAINE HCL (PF) 0.5 % IJ SOLN
INTRAMUSCULAR | Status: DC | PRN
  Administered 2020-08-27: 7 mL

## 2020-08-27 MED ORDER — NAPROXEN 250 MG PO TABS
250.0000 mg | ORAL_TABLET | Freq: Two times a day (BID) | ORAL | Status: DC
Start: 2020-08-27 — End: 2020-08-27

## 2020-08-27 MED ORDER — ONDANSETRON HCL 4 MG/2ML IJ SOLN: 4.0000 mg | Freq: Four times a day (QID) | INTRAMUSCULAR | Status: DC | PRN

## 2020-08-27 MED ORDER — MIDAZOLAM HCL 5 MG/5ML IJ SOLN
INTRAMUSCULAR | Status: DC | PRN
  Administered 2020-08-27: 2 mg via INTRAVENOUS

## 2020-08-27 MED ORDER — SCOPOLAMINE 1 MG/3DAYS TD PT72
MEDICATED_PATCH | TRANSDERMAL | Status: AC
  Filled 2020-08-27: qty 2

## 2020-08-27 MED ORDER — HYDROCODONE-ACETAMINOPHEN 7.5-325 MG PO TABS: 1.0000 | ORAL_TABLET | ORAL | Status: DC | PRN

## 2020-08-27 MED ORDER — ONDANSETRON HCL 4 MG/2ML IJ SOLN
INTRAMUSCULAR | Status: DC | PRN
  Administered 2020-08-27 (×2): 4 mg via INTRAVENOUS

## 2020-08-27 MED ORDER — PROMETHAZINE HCL 25 MG/ML IJ SOLN: 6.2500 mg | INTRAMUSCULAR | Status: DC | PRN

## 2020-08-27 MED ORDER — OXYCODONE HCL 5 MG/5ML PO SOLN: 5.0000 mg | Freq: Once | ORAL | Status: DC | PRN

## 2020-08-27 SURGICAL SUPPLY — 39 items
APL PRP STRL LF DISP 70% ISPRP (MISCELLANEOUS) ×1
ASMB BLDE STD STRL DISP ECTR (BLADE) ×1
BLADE SLIMLINE EXTR (BLADE) ×3 IMPLANT
BLADE SURG 15 STRL LF DISP TIS (BLADE) ×1 IMPLANT
BLADE SURG 15 STRL SS (BLADE) ×3
BNDG CMPR 9X4 STRL LF SNTH (GAUZE/BANDAGES/DRESSINGS) ×1
BNDG ELASTIC 4X5.8 VLCR STR LF (GAUZE/BANDAGES/DRESSINGS) ×3 IMPLANT
BNDG ESMARK 4X9 LF (GAUZE/BANDAGES/DRESSINGS) ×3 IMPLANT
CHLORAPREP W/TINT 26 (MISCELLANEOUS) ×3 IMPLANT
CORD BIPOLAR FORCEPS 12FT (ELECTRODE) ×3 IMPLANT
COVER BACK TABLE 60X90IN (DRAPES) ×3 IMPLANT
COVER SURGICAL LIGHT HANDLE (MISCELLANEOUS) ×3 IMPLANT
COVER WAND RF STERILE (DRAPES) ×3 IMPLANT
CUFF TOURN SGL QUICK 18X4 (TOURNIQUET CUFF) IMPLANT
DRAPE EXTREMITY T 121X128X90 (DISPOSABLE) ×3 IMPLANT
DRAPE IMP U-DRAPE 54X76 (DRAPES) ×3 IMPLANT
DRAPE SURG 17X23 STRL (DRAPES) ×3 IMPLANT
DRSG EMULSION OIL 3X3 NADH (GAUZE/BANDAGES/DRESSINGS) ×3 IMPLANT
DRSG TEGADERM 2-3/8X2-3/4 SM (GAUZE/BANDAGES/DRESSINGS) ×3 IMPLANT
DRSG TEGADERM 4X4.75 (GAUZE/BANDAGES/DRESSINGS) ×3 IMPLANT
GAUZE SPONGE 4X4 12PLY STRL (GAUZE/BANDAGES/DRESSINGS) ×3 IMPLANT
GAUZE SPONGE 4X4 12PLY STRL LF (GAUZE/BANDAGES/DRESSINGS) ×3 IMPLANT
GLOVE BIO SURGEON STRL SZ7.5 (GLOVE) ×6 IMPLANT
GLOVE BIOGEL PI IND STRL 8 (GLOVE) ×2 IMPLANT
GLOVE BIOGEL PI INDICATOR 8 (GLOVE) ×4
GOWN STRL REUS W/ TWL LRG LVL3 (GOWN DISPOSABLE) ×1 IMPLANT
GOWN STRL REUS W/ TWL XL LVL3 (GOWN DISPOSABLE) ×2 IMPLANT
GOWN STRL REUS W/TWL LRG LVL3 (GOWN DISPOSABLE) ×3
GOWN STRL REUS W/TWL XL LVL3 (GOWN DISPOSABLE) ×6
KIT TURNOVER CYSTO (KITS) ×3 IMPLANT
NEEDLE HYPO 25X1 1.5 SAFETY (NEEDLE) IMPLANT
NS IRRIG 1000ML POUR BTL (IV SOLUTION) ×3 IMPLANT
PACK BASIN DAY SURGERY FS (CUSTOM PROCEDURE TRAY) ×3 IMPLANT
SOL ANTI FOG 6CC (MISCELLANEOUS) ×1 IMPLANT
SOLUTION ANTI FOG 6CC (MISCELLANEOUS) ×2
SUT ETHILON 3 0 PS 1 (SUTURE) ×3 IMPLANT
SYR CONTROL 10ML LL (SYRINGE) ×3 IMPLANT
SmartRelease Blad ×3 IMPLANT
UNDERPAD 30X36 HEAVY ABSORB (UNDERPADS AND DIAPERS) ×3 IMPLANT

## 2020-08-27 NOTE — Anesthesia Postprocedure Evaluation (Signed)
Anesthesia Post Note  Patient: Tiffany Young. [name]  Procedure(s) Performed: CARPAL TUNNEL RELEASE ENDOSCOPIC (Left Hand)     Patient location during evaluation: PACU Anesthesia Type: General Level of consciousness: awake and alert Pain management: pain level controlled Vital Signs Assessment: post-procedure vital signs reviewed and stable Respiratory status: spontaneous breathing, nonlabored ventilation, respiratory function stable and patient connected to nasal cannula oxygen Cardiovascular status: blood pressure returned to baseline and stable Postop Assessment: no apparent nausea or vomiting Anesthetic complications: no   No complications documented.  Last Vitals:  Vitals:   08/27/20 1030 08/27/20 1115  BP:  121/64  Pulse: 77 80  Resp: 13 18  Temp:  36.7 C  SpO2: 96% 99%    Last Pain:  Vitals:   08/27/20 1115  TempSrc:   PainSc: 0-No pain                 [name] P [name]

## 2020-08-27 NOTE — Anesthesia Procedure Notes (Signed)
Procedure Name: LMA Insertion Date/Time: 08/27/2020 9:08 AM Performed by: Marny Lowenstein, CRNA Pre-anesthesia Checklist: Patient identified, Emergency Drugs available, Suction available and Patient being monitored Patient Re-evaluated:Patient Re-evaluated prior to induction Oxygen Delivery Method: Circle system utilized Preoxygenation: Pre-oxygenation with 100% oxygen Induction Type: IV induction Ventilation: Mask ventilation without difficulty LMA: LMA inserted LMA Size: 4.0 Number of attempts: 1 Airway Equipment and Method: Patient positioned with wedge pillow Placement Confirmation: positive ETCO2 and breath sounds checked- equal and bilateral Dental Injury: Teeth and Oropharynx as per pre-operative assessment

## 2020-08-27 NOTE — Discharge Instructions (Signed)
  Post Anesthesia Home Care Instructions  Activity: Get plenty of rest for the remainder of the day. A responsible adult should stay with you for 24 hours following the procedure.  For the next 24 hours, DO NOT: -Drive a car -Operate machinery -Drink alcoholic beverages -Take any medication unless instructed by your physician -Make any legal decisions or sign important papers.  Meals: Start with liquid foods such as gelatin or soup. Progress to regular foods as tolerated. Avoid greasy, spicy, heavy foods. If nausea and/or vomiting occur, drink only clear liquids until the nausea and/or vomiting subsides. Call your physician if vomiting continues.  Special Instructions/Symptoms: Your throat may feel dry or sore from the anesthesia or the breathing tube placed in your throat during surgery. If this causes discomfort, gargle with warm salt water. The discomfort should disappear within 24 hours.  If you had a scopolamine patch placed behind your ear for the management of post- operative nausea and/or vomiting:  1. The medication in the patch is effective for 72 hours, after which it should be removed.  Wrap patch in a tissue and discard in the trash. Wash hands thoroughly with soap and water. 2. You may remove the patch earlier than 72 hours if you experience unpleasant side effects which may include dry mouth, dizziness or visual disturbances. 3. Avoid touching the patch. Wash your hands with soap and water after contact with the patch.   Keep wrist elevated with ice as much as possible to reduce pain and swelling.  Diet: As you were doing prior to hospitalization   Shower / Dressing:  Leave dressing in place and dry for 3 days.  You may then remove dressings and shower over incision.  No bath or submerging incision.  Place clean Band-Aid over incision.  Activity:  Increase activity slowly as tolerated, but follow the weight bearing instructions below.  The rules on driving is that you can  not be taking narcotics while you drive, and you must feel in control of the vehicle.    Weight Bearing:  Non weight bearing affected wrist.    To prevent constipation: you may use a stool softener such as -  Colace (over the counter) 100 mg by mouth twice a day  Drink plenty of fluids (prune juice may be helpful) and high fiber foods Miralax (over the counter) for constipation as needed.    Itching:  If you experience itching with your medications, try taking only a single pain pill, or even half a pain pill at a time.  You can also use benadryl over the counter for itching or also to help with sleep.   Precautions:  If you experience chest pain or shortness of breath - call 911 immediately for transfer to the hospital emergency department!!  If you develop a fever greater that 101 F, purulent drainage from wound, increased redness or drainage from wound, or calf pain -- Call the office at [phone]                                         Follow- Up Appointment:  Please call for an appointment to be seen in 2 weeks [place] - [phone]     

## 2020-08-27 NOTE — Transfer of Care (Signed)
Immediate Anesthesia Transfer of Care Note  Patient: Tiffany Young. Mccay  Procedure(s) Performed: CARPAL TUNNEL RELEASE ENDOSCOPIC (Left Hand)  Patient Location: PACU  Anesthesia Type:General  Level of Consciousness: drowsy and patient cooperative  Airway & Oxygen Therapy: Patient Spontanous Breathing and Patient connected to face mask oxygen  Post-op Assessment: Report given to RN and Post -op Vital signs reviewed and stable  Post vital signs: Reviewed and stable  Last Vitals:  Vitals Value Taken Time  BP 143/80 08/27/20 0939  Temp 36.4 C 08/27/20 0939  Pulse 115 08/27/20 0939  Resp 23 08/27/20 0939  SpO2 97 % 08/27/20 0939    Last Pain:  Vitals:   08/27/20 0939  TempSrc: Oral  PainSc:       Patients Stated Pain Goal: 5 (08/27/20 0723)  Complications: No complications documented.

## 2020-08-27 NOTE — Op Note (Signed)
08/27/2020  9:33 AM  PATIENT:  Tiffany Young. Giampietro    PRE-OPERATIVE DIAGNOSIS:  LEFT CARPAL TUNNEL SYNDROME  POST-OPERATIVE DIAGNOSIS:  Same  PROCEDURE:  CARPAL TUNNEL RELEASE ENDOSCOPIC  SURGEON:  Sheral Apley, MD  PHYSICIAN ASSISTANT: Daun Peacock, PA-C, he was present and scrubbed throughout the case, critical for completion in a timely fashion, and for retraction, instrumentation, and closure.   ANESTHESIA:   General  PREOPERATIVE INDICATIONS:  [name] is a  49 y.o. female with a diagnosis of LEFT CARPAL TUNNEL SYNDROME who failed conservative measures and elected for surgical management.    The risks benefits and alternatives were discussed with the patient preoperatively including but not limited to the risks of infection, bleeding, nerve injury, incomplete relief of symptoms, pillar pain, cardiopulmonary complications, the need for revision surgery, among others, and the patient was willing to proceed.  OPERATIVE PROCEDURE: Patient was identified in the preoperative holding area and site was marked by me. female was transported to the operating theater and placed on the table in the supine position taking care to pad all bony prominences. After appropriate time out general anesthesia was induced and female received ancef for preoperative antibiotics. The extremity was prepped and draped in normal sterile fashion.  I made a 1 severe incision just proximal to the dominant volar wrist crease in line with the radial aspect of the small finger. Spread down to the fascia and this was incised in line with the incision. Metzenbaum scissors were then prepped passed above and below the fascia proximally to clear the nerve away from the fascia clear soft tissue plane superficial to it as well. This was then incised.  I then turned attention distally where the fascia edge was as was visible within the wound. I sequentially dilated the carpal tunnel sweeping soft tissue off  of the undersurface of the ligament which was palpable throughout each past. I then inserted the endoscope and under direct visualization was able to see the undersurface the carpal tunnel throughout the entire insertion. Wasn't visualized the distal end of the ligament I put the blade retracted the instrument incising the transverse carpal ligament. I then reinserted the endoscope and directly visualized each cut and as well as intact median nerve without harm.   Wound was then irrigated and closed with a superficial suture. A sterile dressing and splint was applied   She tolerated this well, with no complications.  POSTOPERATIVE PLAN: Keep the splint clean and dry. VTE prophylaxis will consist of ambulation and foot pump exercises and possible chemical px as indicated

## 2020-08-27 NOTE — Interval H&P Note (Signed)
History and Physical Interval Note:  08/27/2020 7:23 AM  Tiffany Young  has presented today for surgery, with the diagnosis of LEFT CARPAL TUNNEL SYNDROME.  The various methods of treatment have been discussed with the patient and family. After consideration of risks, benefits and other options for treatment, the patient has consented to  Procedure(s): CARPAL TUNNEL RELEASE ENDOSCOPIC (Left) as a surgical intervention.  The patient's history has been reviewed, patient examined, no change in status, stable for surgery.  I have reviewed the patient's chart and labs.  Questions were answered to the patient's satisfaction.     Sheral Apley

## 2020-09-02 ENCOUNTER — Encounter (HOSPITAL_BASED_OUTPATIENT_CLINIC_OR_DEPARTMENT_OTHER): Payer: Self-pay | Admitting: Orthopedic Surgery

## 2020-09-28 ENCOUNTER — Encounter (HOSPITAL_COMMUNITY): Payer: Self-pay

## 2020-09-28 ENCOUNTER — Other Ambulatory Visit: Payer: Self-pay

## 2020-09-28 ENCOUNTER — Emergency Department (HOSPITAL_COMMUNITY)
Admission: EM | Admit: 2020-09-28 | Discharge: 2020-09-28 | Disposition: A | Discharge status: Home / Self Care | Payer: 59 | Attending: Emergency Medicine | Admitting: Emergency Medicine

## 2020-09-28 DIAGNOSIS — K047 Periapical abscess without sinus: Secondary | ICD-10-CM | POA: Insufficient documentation

## 2020-09-28 DIAGNOSIS — K0889 Other specified disorders of teeth and supporting structures: Secondary | ICD-10-CM | POA: Diagnosis present

## 2020-09-28 MED ORDER — AMOXICILLIN-POT CLAVULANATE 875-125 MG PO TABS: 1.0000 | ORAL_TABLET | Freq: Two times a day (BID) | ORAL | 0 refills | Status: DC

## 2020-09-28 MED ORDER — LIDOCAINE VISCOUS HCL 2 % MT SOLN: 15.0000 mL | OROMUCOSAL | 0 refills | Status: DC | PRN

## 2020-09-28 MED ORDER — FLUCONAZOLE 150 MG PO TABS: 150.0000 mg | ORAL_TABLET | Freq: Every day | ORAL | 0 refills | Status: DC

## 2020-09-28 MED ORDER — IBUPROFEN 800 MG PO TABS: 800.0000 mg | ORAL_TABLET | Freq: Three times a day (TID) | ORAL | 0 refills | Status: DC | PRN

## 2020-09-28 NOTE — ED Provider Notes (Signed)
[facility]-EMERGENCY DEPT Provider Note   CSN: [id number] Arrival date & time: 09/28/20  2007     History No chief complaint on file.   Tiffany Young is a 49 y.o. female presenting for evaluation of dental pain and facial swelling.  Patient states yesterday she developed dental pain, resolved with Aleve.  When she woke up today, she had worsening pain and facial swelling.  She borrowed some antibiotics from a friend, ended up taking 8 pills of 875 Augmentin today.  She also took Aleve and 800 mg Motrin.  She reports pain is improved.  She still has swelling, but states it is no longer worsening.  She has no fevers or chills.  No pain in or behind her eye.  No difficulty opening her mouth or handling secretions.  She has a history of problems with this tooth, follows with a dentist but they were not available to see her today.  She denies nausea, vomiting, generalized weakness, or confusion.  She has no other medical problems, takes medications daily  HPI     Past Medical History:  Diagnosis Date  . Carpal tunnel syndrome   . GERD (gastroesophageal reflux disease)   . High cholesterol   . History of kidney stones   . Joint pain   . Knee pain, bilateral   . PONV (postoperative nausea and vomiting)    After surgery in 2001  . Pre-diabetes   . Stomach ulcer early 2000's  . Symptomatic mammary hypertrophy 03/01/2020    Patient Active Problem List   Diagnosis Date Noted  . Symptomatic mammary hypertrophy 05/28/2020  . Recurrent kidney stones 04/15/2020  . Morbid obesity (HCC) 04/15/2020  . Hyperlipidemia 04/15/2020  . Neck pain 03/01/2020  . Back pain 03/01/2020  . Breast wound, right, initial encounter 03/01/2020    Past Surgical History:  Procedure Laterality Date  . APPLICATION OF A-CELL OF CHEST/ABDOMEN Bilateral 06/03/2020   Procedure: APPLICATION OF A-CELL OF CHEST/ABDOMEN;  Surgeon: Peggye Form, DO;  Location: [facility];  Service: Plastics;  Laterality: Bilateral;  . BREAST REDUCTION SURGERY Bilateral 05/09/2020   Procedure: BILATERAL BREAST REDUCTION WITH LIPOSUCTION;  Surgeon: Peggye Form, DO;  Location: [facility];  Service: Plastics;  Laterality: Bilateral;  3.5 hours, please  . CARPAL TUNNEL RELEASE Left 08/27/2020   Procedure: CARPAL TUNNEL RELEASE ENDOSCOPIC;  Surgeon: Sheral Apley, MD;  Location: [facility];  Service: Orthopedics;  Laterality: Left;  . INCISION AND DRAINAGE OF WOUND Bilateral 06/03/2020   Procedure: Irrigation/Debridement bilateral breast wounds;  Surgeon: Peggye Form, DO;  Location: [facility];  Service: Plastics;  Laterality: Bilateral;  1 hour  . KNEE SURGERY Left 1995   arthroscopic  . left heel spur Right 2001 oe 2002     OB History    Gravida  1   Para  1   Term      Preterm      AB      Living        SAB      IAB      Ectopic      Multiple      Live Births              Family History  Problem Relation Age of Onset  . Breast cancer Paternal Aunt   . Breast cancer Cousin   . Diabetes Mother   . Stroke Mother   . Kidney disease  Mother   . Cancer Mother   . AAA (abdominal aortic aneurysm) Father     Social History   Tobacco Use  . Smoking status: Never Smoker  . Smokeless tobacco: Never Used  Vaping Use  . Vaping Use: Never used  Substance Use Topics  . Alcohol use: No  . Drug use: Never    Home Medications Prior to Admission medications   Medication Sig Start Date End Date Taking? Authorizing Provider  amoxicillin-clavulanate (AUGMENTIN) 875-125 MG tablet Take 1 tablet by mouth every 12 (twelve) hours. 09/28/20   Devyn Griffing, PA-C  atorvastatin (LIPITOR) 20 MG tablet Take 20 mg by mouth daily. 01/03/20   [provider]  Biotin 76734 MCG TABS Take by mouth.    [provider]  cetirizine (ZYRTEC) 10 MG tablet Take 10 mg by mouth daily.     [provider]  fluconazole (DIFLUCAN) 150 MG tablet Take 1 tablet (150 mg total) by mouth daily. Take one tablet as needed for signs of a yeast infection after finishing all of the antibiotics. 09/28/20   Anand Tejada, PA-C  HYDROcodone-acetaminophen (NORCO/VICODIN) 5-325 MG tablet Take 1 tablet by mouth every 4 (four) hours as needed for moderate pain. 08/27/20 08/27/21  Rainey Pines, PA-C  ibuprofen (ADVIL) 800 MG tablet Take 1 tablet (800 mg total) by mouth 3 (three) times daily with meals as needed. 09/28/20   Sullivan Blasing, PA-C  lidocaine (XYLOCAINE) 2 % solution Use as directed 15 mLs in the mouth or throat as needed for mouth pain. 09/28/20   Torrance Frech, PA-C  Multiple Vitamins-Minerals (MULTIVITAMIN WITH MINERALS) tablet Take 1 tablet by mouth daily.    [provider]  omeprazole (PRILOSEC) 20 MG capsule Take 20 mg by mouth as needed.     [provider]    Allergies    Patient has no known allergies.  Review of Systems   Review of Systems  Constitutional: Negative for fever.  HENT: Positive for dental problem and facial swelling.     Physical Exam Updated Vital Signs BP (!) 161/97 (BP Location: Left Arm)   Pulse 94   Temp 97.8 F (36.6 C) (Oral)   Resp 18   Ht 5\' 5"  (1.651 m)   Wt 123.4 kg   SpO2 99%   BMI 45.26 kg/m   Physical Exam Vitals and nursing note reviewed.  Constitutional:      General: She is not in acute distress.    Appearance: She is well-developed and well-nourished.     Comments: Resting in the bed in no acute distress  HENT:     Head: Normocephalic and atraumatic.     Comments: Left-sided facial swelling extending to the cheek.  No erythema or skin induration.    Mouth/Throat:     Dentition: Abnormal dentition. Dental tenderness and gingival swelling present.      Comments: Overall poor dentition, multiple teeth are missing.  Tenderness palpation of left upper tooth with surrounding gingival  edema.  No purulence or clear fluctuance.  No trismus.  Handling secretions easily. Eyes:     Extraocular Movements: EOM normal.     Conjunctiva/sclera: Conjunctivae normal.     Pupils: Pupils are equal, round, and reactive to light.  Cardiovascular:     Rate and Rhythm: Normal rate and regular rhythm.     Pulses: Normal pulses and intact distal pulses.  Pulmonary:     Effort: Pulmonary effort is normal. No respiratory distress.     Breath sounds:  Normal breath sounds. No wheezing.  Abdominal:     General: There is no distension.     Palpations: Abdomen is soft. There is no mass.     Tenderness: There is no abdominal tenderness. There is no guarding or rebound.  Musculoskeletal:        General: Normal range of motion.     Cervical back: Normal range of motion and neck supple.  Skin:    General: Skin is warm and dry.     Capillary Refill: Capillary refill takes less than 2 seconds.  Neurological:     Mental Status: She is alert and oriented to person, place, and time.  Psychiatric:        Mood and Affect: Mood and affect normal.     ED Results / Procedures / Treatments   Labs (all labs ordered are listed, but only abnormal results are displayed) Labs Reviewed - No data to display  EKG None  Radiology No results found.  Procedures Procedures (including critical care time)  Medications Ordered in ED Medications - No data to display  ED Course  I have reviewed the triage vital signs and the nursing notes.  Pertinent labs & imaging results that were available during my care of the patient were reviewed by me and considered in my medical decision making (see chart for details).    MDM Rules/Calculators/A&P                          Patient presented for evaluation of dental pain and facial swelling.  On exam, patient appears nontoxic.  Vital signs overall reassuring, exam is not consistent with sepsis.  Not consistent with Ludwick's.  She has taken antibiotics already  today, more than she needs to.  However it was the appropriate antibiotic.  Her pain is improving, and her swelling is not worsening.  I discussed appropriate antibiotic use and pain control.  Follow-up with dentistry.  I have low suspicion for concerning tracking infection/abscess, I do not believe she needs labs or head CT today.  At this time, patient appears safe for discharge.  Return precautions given.  Patient states she understands and agrees to plan.  Final Clinical Impression(s) / ED Diagnoses Final diagnoses:  Dental infection    Rx / DC Orders ED Discharge Orders         Ordered    amoxicillin-clavulanate (AUGMENTIN) 875-125 MG tablet  Every 12 hours        09/28/20 2239    ibuprofen (ADVIL) 800 MG tablet  3 times daily with meals PRN        09/28/20 2239    lidocaine (XYLOCAINE) 2 % solution  As needed        09/28/20 2239    fluconazole (DIFLUCAN) 150 MG tablet  Daily        09/28/20 2239           Alveria Apley, PA-C 09/28/20 2308    Molpus, John, MD 09/29/20 0700

## 2020-09-28 NOTE — ED Triage Notes (Signed)
Patient arrived stating that she was having left upper dental pain that started yesterday evening, reports that today she began swelling up to her nose. Reports taking an antibiotic today and OTC pain medication.

## 2020-09-28 NOTE — Discharge Instructions (Addendum)
Take antibiotics twice a day as prescribed for the next week. Use ibuprofen 800 mg as needed for pain up to 3 times a day with meals.  Do not take other anti-inflammatory such as naproxen, Aleve, Advil, aspirin. You may supplement with Tylenol as needed for further pain control. You may use the viscous lidocaine to swish around the area to help with pain control. When you are done with the antibiotics, if you are having symptoms of a yeast infection you may take the Diflucan pill. It is important that you follow-up with your dentist for further evaluation management of your tooth. Return to the emergency room if you develop high fevers, double vision, pain behind your eye, difficulty opening your mouth, difficulty swallowing, any new, worsening, concerning symptoms.

## 2020-11-11 ENCOUNTER — Other Ambulatory Visit: Payer: Self-pay | Admitting: Physician Assistant

## 2020-11-11 DIAGNOSIS — Z1231 Encounter for screening mammogram for malignant neoplasm of breast: Secondary | ICD-10-CM

## 2020-12-16 ENCOUNTER — Other Ambulatory Visit: Payer: Self-pay

## 2020-12-16 ENCOUNTER — Ambulatory Visit (INDEPENDENT_AMBULATORY_CARE_PROVIDER_SITE_OTHER): Payer: 59

## 2020-12-16 ENCOUNTER — Ambulatory Visit (INDEPENDENT_AMBULATORY_CARE_PROVIDER_SITE_OTHER): Payer: 59 | Admitting: Podiatry

## 2020-12-16 DIAGNOSIS — M7731 Calcaneal spur, right foot: Secondary | ICD-10-CM | POA: Diagnosis not present

## 2020-12-16 DIAGNOSIS — M722 Plantar fascial fibromatosis: Secondary | ICD-10-CM

## 2020-12-16 MED ORDER — MELOXICAM 15 MG PO TABS: 15.0000 mg | ORAL_TABLET | Freq: Every day | ORAL | 1 refills | Status: DC

## 2020-12-16 MED ORDER — BETAMETHASONE SOD PHOS & ACET 6 (3-3) MG/ML IJ SUSP
3.0000 mg | Freq: Once | INTRAMUSCULAR | Status: AC
  Administered 2020-12-16: 3 mg via INTRA_ARTICULAR

## 2020-12-16 NOTE — Progress Notes (Signed)
   Subjective: 51 y.o. female presenting to the office today as a new patient for evaluation of right heel pain is been going on approximately 7 months or so.  She denies a history of injury.  She does have history of plantar fascial surgery in 2002.  She states that she has been experiencing pain to the lateral aspect of the heel.  She presents for further treatment and evaluation   Past Medical History:  Diagnosis Date  . Carpal tunnel syndrome   . GERD (gastroesophageal reflux disease)   . High cholesterol   . History of kidney stones   . Joint pain   . Knee pain, bilateral   . PONV (postoperative nausea and vomiting)    After surgery in 2001  . Pre-diabetes   . Stomach ulcer early 2000's  . Symptomatic mammary hypertrophy 03/01/2020     Objective: Physical Exam General: The patient is alert and oriented x3 in no acute distress.  Dermatology: Skin is warm, dry and supple bilateral lower extremities. Negative for open lesions or macerations bilateral.   Vascular: Dorsalis Pedis and Posterior Tibial pulses palpable bilateral.  Capillary fill time is immediate to all digits.  Neurological: Epicritic and protective threshold intact bilateral.   Musculoskeletal: Tenderness to palpation to the plantar lateral aspect of the right heel along the plantar fascia. All other joints range of motion within normal limits bilateral. Strength 5/5 in all groups bilateral.   Radiographic exam: Normal osseous mineralization. Joint spaces preserved. No fracture/dislocation/boney destruction. No other soft tissue abnormalities or radiopaque foreign bodies.   Assessment: 1. Plantar fasciitis right  Plan of Care:  1. Patient evaluated. Xrays reviewed.   2. Injection of 0.5cc Celestone soluspan injected into the right plantar fascia lateral aspect 3.  Recommend that the patient purchase a pair of sandals with good arch supports 4. Rx for Meloxicam ordered for patient. 5.Instructed patient  regarding therapies and modalities at home to alleviate symptoms.  6.  Return to clinic as needed  *Came to the office and did my [employer] policy physical   Felecia Shelling, DPM [facility]  Dr. Felecia Shelling, DPM    [address] [place], [place] [address]                Office [phone]  Fax [phone]

## 2021-01-24 ENCOUNTER — Ambulatory Visit: Payer: 59

## 2021-02-06 ENCOUNTER — Ambulatory Visit (HOSPITAL_COMMUNITY)
Admission: EM | Admit: 2021-02-06 | Discharge: 2021-02-06 | Disposition: A | Discharge status: Home / Self Care | Payer: 59 | Attending: Family Medicine | Admitting: Family Medicine

## 2021-02-06 ENCOUNTER — Encounter (HOSPITAL_COMMUNITY): Payer: Self-pay | Admitting: Emergency Medicine

## 2021-02-06 DIAGNOSIS — S161XXA Strain of muscle, fascia and tendon at neck level, initial encounter: Secondary | ICD-10-CM

## 2021-02-06 MED ORDER — IBUPROFEN 800 MG PO TABS: 800.0000 mg | ORAL_TABLET | Freq: Three times a day (TID) | ORAL | 0 refills | Status: DC

## 2021-02-06 MED ORDER — CYCLOBENZAPRINE HCL 10 MG PO TABS: ORAL_TABLET | ORAL | 0 refills | Status: DC
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
## 2021-02-17 ENCOUNTER — Other Ambulatory Visit: Payer: Self-pay

## 2021-02-17 ENCOUNTER — Emergency Department (HOSPITAL_COMMUNITY): Payer: 59

## 2021-02-17 ENCOUNTER — Emergency Department (HOSPITAL_COMMUNITY)
Admission: EM | Admit: 2021-02-17 | Discharge: 2021-02-17 | Disposition: A | Discharge status: Home / Self Care | Payer: 59 | Attending: Emergency Medicine | Admitting: Emergency Medicine

## 2021-02-17 ENCOUNTER — Encounter (HOSPITAL_COMMUNITY): Payer: Self-pay | Admitting: *Deleted

## 2021-02-17 DIAGNOSIS — R2232 Localized swelling, mass and lump, left upper limb: Secondary | ICD-10-CM | POA: Diagnosis not present

## 2021-02-17 DIAGNOSIS — S29011A Strain of muscle and tendon of front wall of thorax, initial encounter: Secondary | ICD-10-CM | POA: Diagnosis not present

## 2021-02-17 DIAGNOSIS — S299XXA Unspecified injury of thorax, initial encounter: Secondary | ICD-10-CM | POA: Diagnosis present

## 2021-02-17 DIAGNOSIS — M47812 Spondylosis without myelopathy or radiculopathy, cervical region: Secondary | ICD-10-CM | POA: Insufficient documentation

## 2021-02-17 DIAGNOSIS — T148XXA Other injury of unspecified body region, initial encounter: Secondary | ICD-10-CM

## 2021-02-17 DIAGNOSIS — M62830 Muscle spasm of back: Secondary | ICD-10-CM | POA: Insufficient documentation

## 2021-02-17 DIAGNOSIS — Y92410 Unspecified street and highway as the place of occurrence of the external cause: Secondary | ICD-10-CM | POA: Diagnosis not present
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
## 2021-02-18 ENCOUNTER — Ambulatory Visit (HOSPITAL_COMMUNITY): Payer: 59

## 2021-03-14 ENCOUNTER — Other Ambulatory Visit: Payer: Self-pay

## 2021-03-14 ENCOUNTER — Ambulatory Visit
Admission: RE | Admit: 2021-03-14 | Discharge: 2021-03-14 | Disposition: A | Discharge status: Home / Self Care | Payer: 59 | Source: Ambulatory Visit | Attending: Physician Assistant | Admitting: Physician Assistant

## 2021-03-14 DIAGNOSIS — Z1231 Encounter for screening mammogram for malignant neoplasm of breast: Secondary | ICD-10-CM

## 2021-12-05 ENCOUNTER — Other Ambulatory Visit: Payer: Self-pay

## 2021-12-05 ENCOUNTER — Ambulatory Visit
Admission: EM | Admit: 2021-12-05 | Discharge: 2021-12-05 | Disposition: A | Discharge status: Home / Self Care | Payer: Self-pay | Attending: Urgent Care | Admitting: Urgent Care

## 2021-12-05 DIAGNOSIS — Z23 Encounter for immunization: Secondary | ICD-10-CM

## 2021-12-05 DIAGNOSIS — S61213A Laceration without foreign body of left middle finger without damage to nail, initial encounter: Secondary | ICD-10-CM

## 2021-12-05 DIAGNOSIS — M79645 Pain in left finger(s): Secondary | ICD-10-CM

## 2021-12-05 MED ORDER — TETANUS-DIPHTH-ACELL PERTUSSIS 5-2.5-18.5 LF-MCG/0.5 IM SUSY
0.5000 mL | PREFILLED_SYRINGE | Freq: Once | INTRAMUSCULAR | Status: AC
  Administered 2021-12-05: 0.5 mL via INTRAMUSCULAR

## 2021-12-05 NOTE — ED Triage Notes (Signed)
Pt presents with laceration left middle finger  x 2 days. States she cut the left middle finger with a potatoes peeler. Pt used liquid bandage. Aleve gives relief.  ?

## 2021-12-05 NOTE — ED Provider Notes (Signed)
?[place] ? ? ?MRN: [id number] DOB: Apr 02, 1971 ? ?Subjective:  ? ?Tiffany Young is a 51 y.o. female presenting for 2-day history of acute onset persistent left middle finger pain.  Patient excellently cut herself against a potato peeler.  She reached into door and caught her finger.  Has been using peroxide, antibiotic ointment.  She did use a liquid bandage as well.  Has been using Aleve.  She also used an antibiotic that she had leftover from previous prescription.  Needs to have her Tdap updated.  No fever, drainage of pus or bleeding. ? ?No current facility-administered medications for this encounter. ? ?Current Outpatient Medications:  ?  amoxicillin-clavulanate (AUGMENTIN) 875-125 MG tablet, Take 1 tablet by mouth every 12 (twelve) hours., Disp: 14 tablet, Rfl: 0 ?  atorvastatin (LIPITOR) 20 MG tablet, Take 20 mg by mouth daily., Disp: , Rfl:  ?  Biotin 10000 MCG TABS, Take by mouth., Disp: , Rfl:  ?  cephALEXin (KEFLEX) 500 MG capsule, , Disp: , Rfl:  ?  cetirizine (ZYRTEC) 10 MG tablet, Take 10 mg by mouth daily., Disp: , Rfl:  ?  cyclobenzaprine (FLEXERIL) 10 MG tablet, Take 1 tablet by mouth 3 times daily as needed for muscle spasm. Warning: May cause drowsiness., Disp: 21 tablet, Rfl: 0 ?  fluconazole (DIFLUCAN) 150 MG tablet, Take 1 tablet (150 mg total) by mouth daily. Take one tablet as needed for signs of a yeast infection after finishing all of the antibiotics., Disp: 1 tablet, Rfl: 0 ?  ibuprofen (ADVIL) 800 MG tablet, Take 1 tablet (800 mg total) by mouth 3 (three) times daily with meals., Disp: 21 tablet, Rfl: 0 ?  lidocaine (XYLOCAINE) 2 % solution, Use as directed 15 mLs in the mouth or throat as needed for mouth pain., Disp: 100 mL, Rfl: 0 ?  Multiple Vitamins-Minerals (MULTIVITAMIN WITH MINERALS) tablet, Take 1 tablet by mouth daily., Disp: , Rfl:  ?  omeprazole (PRILOSEC) 20 MG capsule, Take 20 mg by mouth as needed. , Disp: , Rfl:   ? ?No Known Allergies ? ?Past  Medical History:  ?Diagnosis Date  ? Carpal tunnel syndrome   ? GERD (gastroesophageal reflux disease)   ? High cholesterol   ? History of kidney stones   ? Joint pain   ? Knee pain, bilateral   ? PONV (postoperative nausea and vomiting)   ? After surgery in 2001  ? Pre-diabetes   ? Stomach ulcer early 2000's  ? Symptomatic mammary hypertrophy 03/01/2020  ?  ? ?Past Surgical History:  ?Procedure Laterality Date  ? APPLICATION OF A-CELL OF CHEST/ABDOMEN Bilateral 06/03/2020  ? Procedure: APPLICATION OF A-CELL OF CHEST/ABDOMEN;  Surgeon: Wallace Going, DO;  Location: [facility];  Service: Plastics;  Laterality: Bilateral;  ? BREAST REDUCTION SURGERY Bilateral 05/09/2020  ? Procedure: BILATERAL BREAST REDUCTION WITH LIPOSUCTION;  Surgeon: Wallace Going, DO;  Location: [place];  Service: Plastics;  Laterality: Bilateral;  3.5 hours, please  ? CARPAL TUNNEL RELEASE Left 08/27/2020  ? Procedure: CARPAL TUNNEL RELEASE ENDOSCOPIC;  Surgeon: Renette Butters, MD;  Location: [facility];  Service: Orthopedics;  Laterality: Left;  ? INCISION AND DRAINAGE OF WOUND Bilateral 06/03/2020  ? Procedure: Irrigation/Debridement bilateral breast wounds;  Surgeon: Wallace Going, DO;  Location: [place];  Service: Plastics;  Laterality: Bilateral;  1 hour  ? KNEE SURGERY Left 1995  ? arthroscopic  ? left heel spur Right 2001 oe 2002  ?  REDUCTION MAMMAPLASTY Bilateral 05/08/2020  ? ? ?Family History  ?Problem Relation Age of Onset  ? Breast cancer Paternal Aunt   ? Breast cancer Cousin   ? Diabetes Mother   ? Stroke Mother   ? Kidney disease Mother   ? Cancer Mother   ? AAA (abdominal aortic aneurysm) Father   ? ? ?Social History  ? ?Tobacco Use  ? Smoking status: Never  ? Smokeless tobacco: Never  ?Vaping Use  ? Vaping Use: Never used  ?Substance Use Topics  ? Alcohol use: No  ? Drug use: Never  ? ? ?ROS ? ? ?Objective:  ? ?Vitals: ?BP 134/90 (BP  Location: Right Arm)   Pulse 88   Temp 98.1 ?F (36.7 ?C) (Oral)   Resp 18   SpO2 96%  ? ?Physical Exam ?Constitutional:   ?   General: She is not in acute distress. ?   Appearance: Normal appearance. She is well-developed. She is not ill-appearing, toxic-appearing or diaphoretic.  ?HENT:  ?   Head: Normocephalic and atraumatic.  ?   Nose: Nose normal.  ?   Mouth/Throat:  ?   Mouth: Mucous membranes are moist.  ?Eyes:  ?   General: No scleral icterus.    ?   Right eye: No discharge.     ?   Left eye: No discharge.  ?   Extraocular Movements: Extraocular movements intact.  ?Cardiovascular:  ?   Rate and Rhythm: Normal rate.  ?Pulmonary:  ?   Effort: Pulmonary effort is normal.  ?Musculoskeletal:  ?     Hands: ? ?Skin: ?   General: Skin is warm and dry.  ?Neurological:  ?   General: No focal deficit present.  ?   Mental Status: She is alert and oriented to person, place, and time.  ?Psychiatric:     ?   Mood and Affect: Mood normal.     ?   Behavior: Behavior normal.     ?   Thought Content: Thought content normal.     ?   Judgment: Judgment normal.  ? ? ?Steri-Strips applied, secured with Coban. ? ?Assessment and Plan :  ? ?PDMP not reviewed this encounter. ? ?1. Finger pain, left   ?2. Laceration of left middle finger without foreign body without damage to nail, initial encounter   ?3. Need for diphtheria-tetanus-pertussis (Tdap) vaccine   ? ?Tdap updated in clinic.  Recommended conservative management.  Discussed wound care. Counseled patient on potential for adverse effects with medications prescribed/recommended today, ER and return-to-clinic precautions discussed, patient verbalized understanding. ? ?  ?Jaynee Eagles, PA-C ?12/05/21 1724 ? ?

## 2021-12-29 ENCOUNTER — Ambulatory Visit: Payer: Self-pay | Admitting: Internal Medicine

## 2021-12-29 NOTE — Progress Notes (Deleted)
?Cardiology Office Note:   ? ?Date:  12/29/2021  ? ?ID:  Tiffany Young, DOB 09/20/1971, MRN [id number] ? ?PCP:  Delma Officer, PA ?  ?[facility] Providers ?Cardiologist:  None { ?Click to update primary MD,subspecialty MD or APP then REFRESH:1}   ? ?Referring MD: Delma Officer, PA  ? ?No chief complaint on file. ?Atherosclerosis on CT ? ?History of Present Illness:   ? ?Tiffany Young is a 51 y.o. female with a hx of obesity, HLD ( LDL 190s in 2015), referral for atherosclerosis noted on non-gated CT ? ?[name] was on statin in the past but stopped 2/2 concern that "it was bad for her". This was restarted by her primary provider [facility] PA. She has no prior cardiac history. ? ?Chest CT 02/17/2021: ?-Cardiovascular: Aortic atherosclerosis. Tortuous thoracic aorta.Normal heart size, without pericardial effusion. Lad coronary artery calcification including on 69/3. ? ?03/15/2020 ?*** ?TC- 201 mg/dL ?LDL- 124 mg/dL ? ? ?EKG 04/15/2020-sinus rhythm, poor r wave progression ? ?Past Medical History:  ?Diagnosis Date  ? Carpal tunnel syndrome   ? GERD (gastroesophageal reflux disease)   ? High cholesterol   ? History of kidney stones   ? Joint pain   ? Knee pain, bilateral   ? PONV (postoperative nausea and vomiting)   ? After surgery in 2001  ? Pre-diabetes   ? Stomach ulcer early 2000's  ? Symptomatic mammary hypertrophy 03/01/2020  ? ? ?Past Surgical History:  ?Procedure Laterality Date  ? APPLICATION OF A-CELL OF CHEST/ABDOMEN Bilateral 06/03/2020  ? Procedure: APPLICATION OF A-CELL OF CHEST/ABDOMEN;  Surgeon: Peggye Form, DO;  Location: [facility];  Service: Plastics;  Laterality: Bilateral;  ? BREAST REDUCTION SURGERY Bilateral 05/09/2020  ? Procedure: BILATERAL BREAST REDUCTION WITH LIPOSUCTION;  Surgeon: Peggye Form, DO;  Location: [facility];  Service: Plastics;  Laterality: Bilateral;  3.5 hours, please  ? CARPAL TUNNEL RELEASE  Left 08/27/2020  ? Procedure: CARPAL TUNNEL RELEASE ENDOSCOPIC;  Surgeon: Sheral Apley, MD;  Location: [facility];  Service: Orthopedics;  Laterality: Left;  ? INCISION AND DRAINAGE OF WOUND Bilateral 06/03/2020  ? Procedure: Irrigation/Debridement bilateral breast wounds;  Surgeon: Peggye Form, DO;  Location: [facility];  Service: Plastics;  Laterality: Bilateral;  1 hour  ? KNEE SURGERY Left 1995  ? arthroscopic  ? left heel spur Right 2001 oe 2002  ? REDUCTION MAMMAPLASTY Bilateral 05/08/2020  ? ? ?Current Medications: ?No outpatient medications have been marked as taking for the 12/29/21 encounter (Appointment) with Maisie Fus, MD.  ?  ? ?Allergies:   Patient has no known allergies.  ? ?Social History  ? ?Socioeconomic History  ? Marital status: Single  ?  Spouse name: Not on file  ? Number of children: Not on file  ? Years of education: Not on file  ? Highest education level: Not on file  ?Occupational History  ? Occupation: Sports coach Admin  ?Tobacco Use  ? Smoking status: Never  ? Smokeless tobacco: Never  ?Vaping Use  ? Vaping Use: Never used  ?Substance and Sexual Activity  ? Alcohol use: No  ? Drug use: Never  ? Sexual activity: Not on file  ?Other Topics Concern  ? Not on file  ?Social History Narrative  ? Not on file  ? ?Social Determinants of Health  ? ?Financial Resource Strain: Not on file  ?Food Insecurity: Not on file  ?Transportation Needs: Not on  file  ?Physical Activity: Not on file  ?Stress: Not on file  ?Social Connections: Not on file  ?  ? ?Family History: ?The patient's ***family history includes AAA (abdominal aortic aneurysm) in her father; Breast cancer in her cousin and paternal aunt; Cancer in her mother; Diabetes in her mother; Kidney disease in her mother; Stroke in her mother. ? ?ROS:   ?Please see the history of present illness.    ?*** All other systems reviewed and are negative. ? ?EKGs/Labs/Other Studies  Reviewed:   ? ?The following studies were reviewed today: ?*** ? ?EKG:  EKG is *** ordered today.  The ekg ordered today demonstrates *** ? ?Recent Labs: ?No results found for requested labs within last 8760 hours.  ?Recent Lipid Panel ?No results found for: CHOL, TRIG, HDL, CHOLHDL, VLDL, LDLCALC, LDLDIRECT ? ? ?Risk Assessment/Calculations:   ?{Does this patient have ATRIAL FIBRILLATION?:[phone]} ? ?    ? ?Physical Exam:   ? ?VS:  There were no vitals taken for this visit.   ? ?Wt Readings from Last 3 Encounters:  ?02/17/21 272 lb (123.4 kg)  ?09/28/20 272 lb (123.4 kg)  ?08/27/20 272 lb (123.4 kg)  ?  ? ?GEN: *** Well nourished, well developed in no acute distress ?HEENT: Normal ?NECK: No JVD; No carotid bruits ?LYMPHATICS: No lymphadenopathy ?CARDIAC: ***RRR, no murmurs, rubs, gallops ?RESPIRATORY:  Clear to auscultation without rales, wheezing or rhonchi  ?ABDOMEN: Soft, non-tender, non-distended ?MUSCULOSKELETAL:  No edema; No deformity  ?SKIN: Warm and dry ?NEUROLOGIC:  Alert and oriented x 3 ?PSYCHIATRIC:  Normal affect  ? ?ASSESSMENT:   ? ?CVD risk mitigation:  Recommend to continue with lifestyle modification and CVD risk mitigation (yearly A1c goal < 7, lipid monitoring ? ? ?HLD: LDL goal < 70 mg/dL. Continue atorvastatin 20 mg daily. ? ?PLAN:   ? ?In order of problems listed above: ? ?Increase atorvastatin ? ?   ? ?{Are you ordering a CV Procedure (e.g. stress test, cath, DCCV, TEE, etc)?   Press F2        :[id number]}  ? ? ?Medication Adjustments/Labs and Tests Ordered: ?Current medicines are reviewed at length with the patient today.  Concerns regarding medicines are outlined above.  ?No orders of the defined types were placed in this encounter. ? ?No orders of the defined types were placed in this encounter. ? ? ?There are no Patient Instructions on file for this visit.  ? ?Signed, ?Maisie Fus, MD  ?12/29/2021 8:28 AM    ?[facility] ?

## 2022-04-14 IMAGING — CT CT RENAL STONE PROTOCOL
2 of 4 series · 17 of 46 positions shown, 19 images · non-contrast
Comparison: March 26, 2019

CLINICAL DATA: Right-sided flank pain.

EXAM:
CT ABDOMEN AND PELVIS WITHOUT CONTRAST
TECHNIQUE: Multidetector CT imaging of the abdomen and pelvis was performed
following the standard protocol without IV contrast.

[Series 2: axial st · axial · 0.88mm/px · z∈[+857,+1257]mm · 14 of 92 slices shown, 16 images]
[im 6/92  soft-tissue]
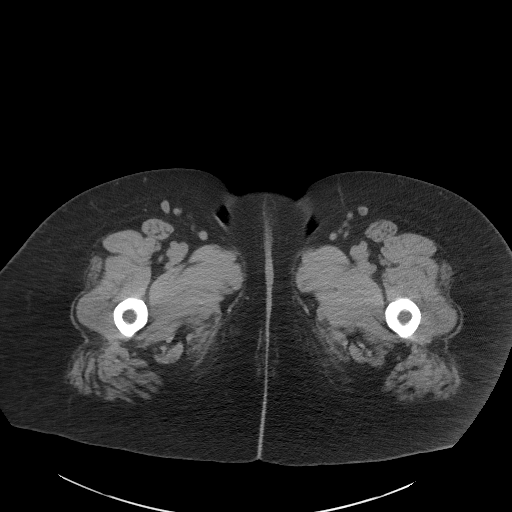
[im 6/92  bone]
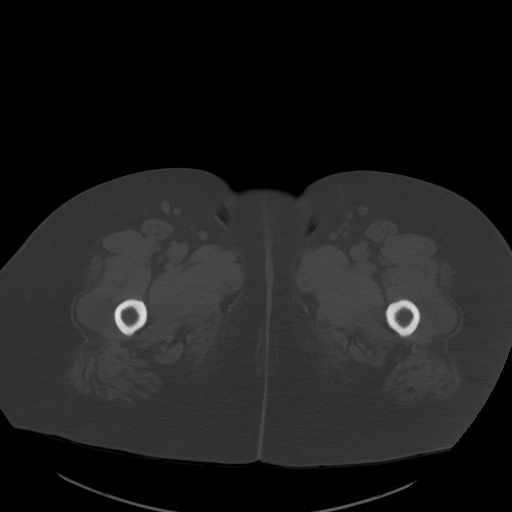
[im 11/92  soft-tissue]
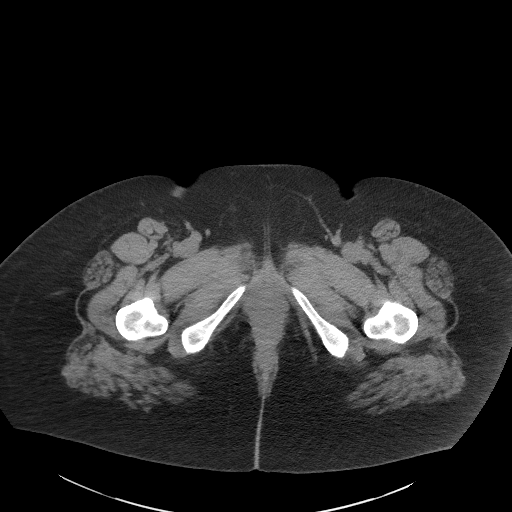
[im 17/92  soft-tissue]
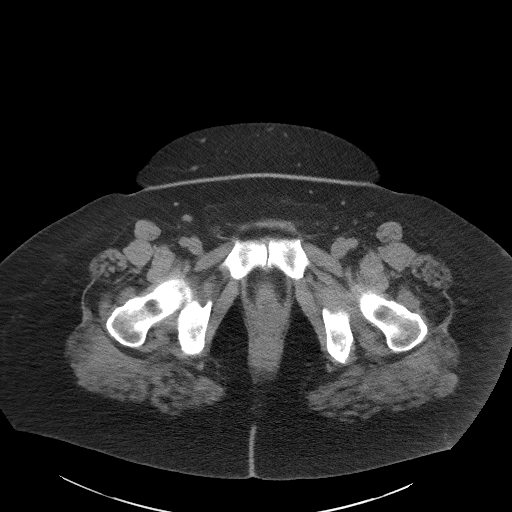
[im 27/92  soft-tissue]
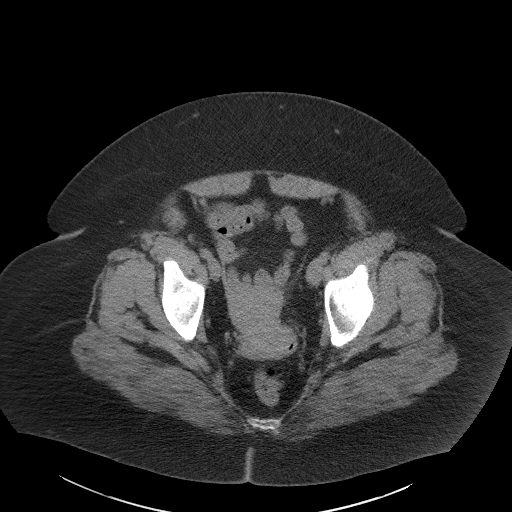
[im 33/92  soft-tissue]
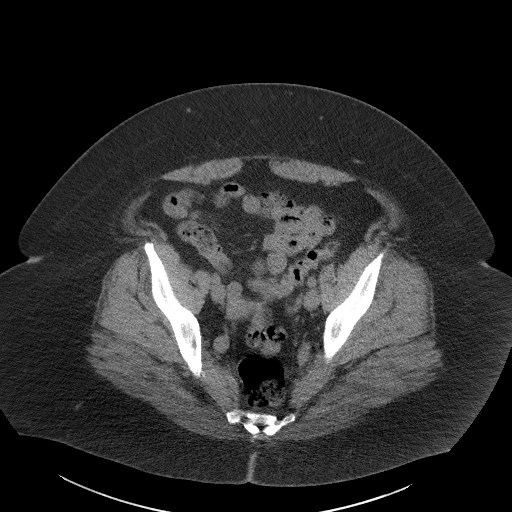
[im 38/92  soft-tissue]
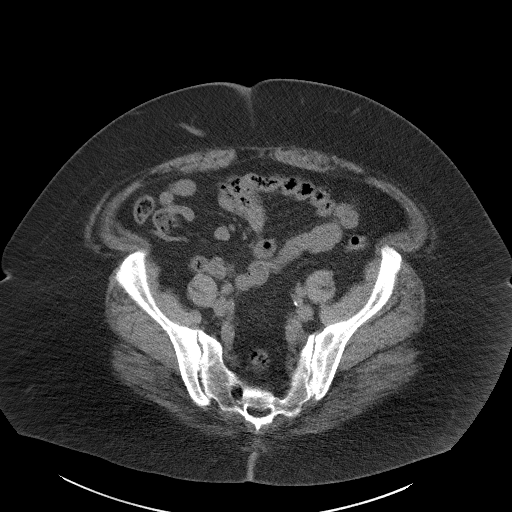
[im 43/92  soft-tissue]
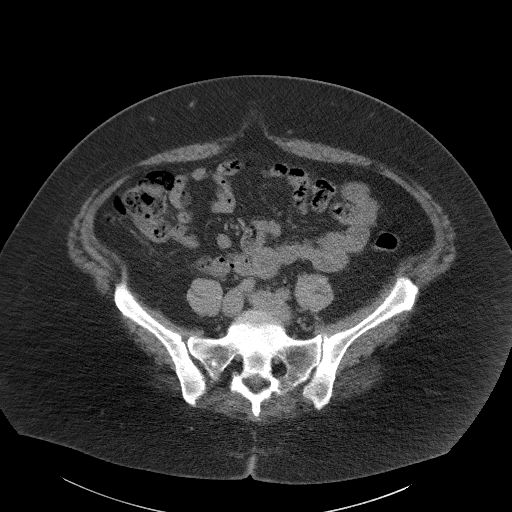
[im 49/92  soft-tissue]
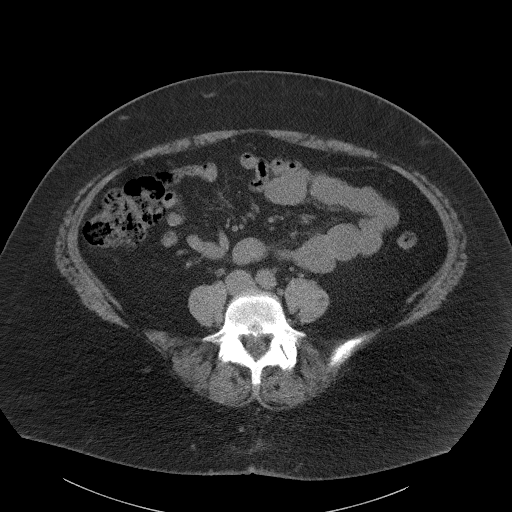
[im 54/92  soft-tissue]
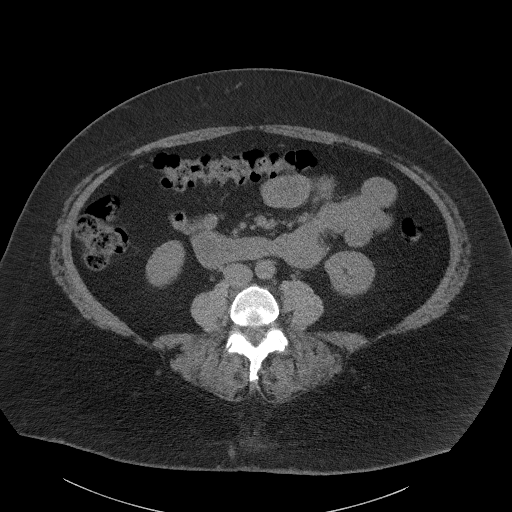
[im 54/92  bone]
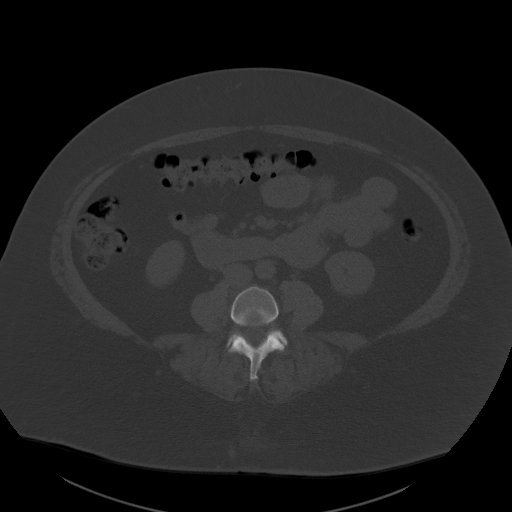
[im 59/92  soft-tissue]
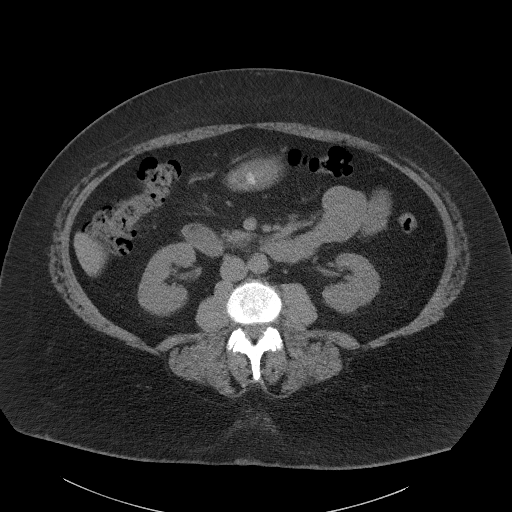
[im 70/92  soft-tissue]
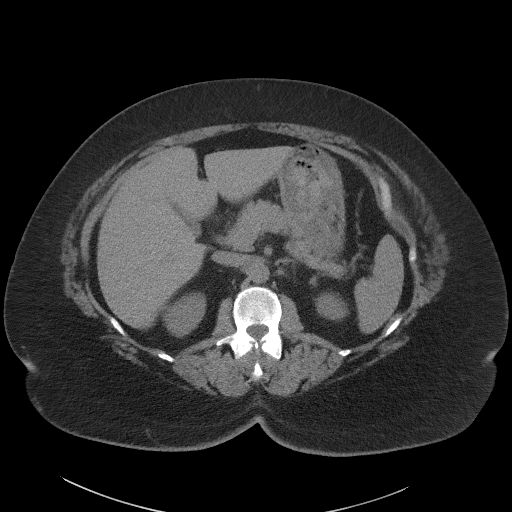
[im 75/92  soft-tissue]
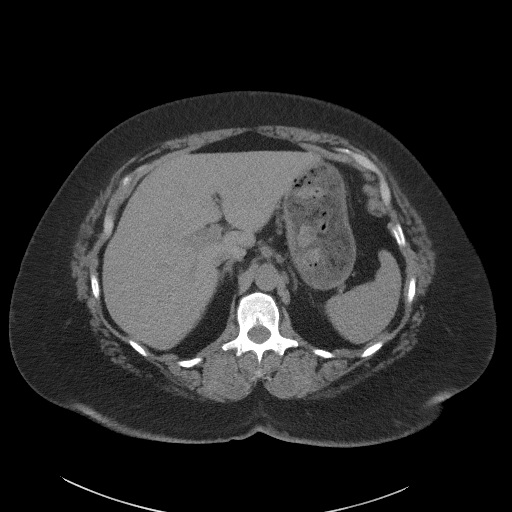
[im 81/92  soft-tissue]
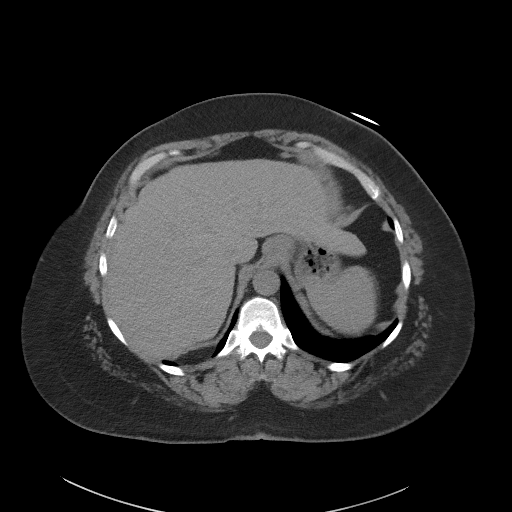
[im 86/92  soft-tissue]
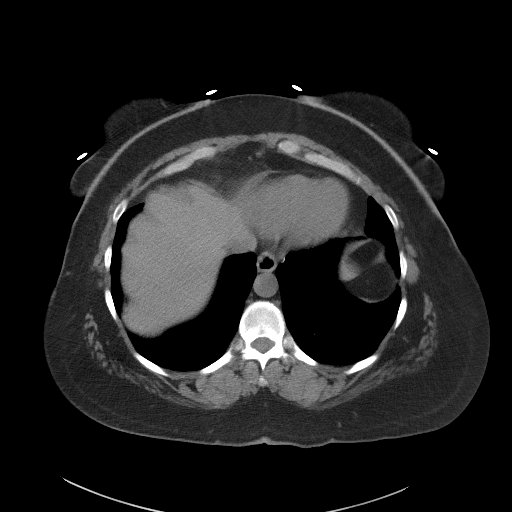

[Series 5: coronal st · coronal · 0.86mm/px · 3 of 126 slices shown]
[im 42/126  soft-tissue]
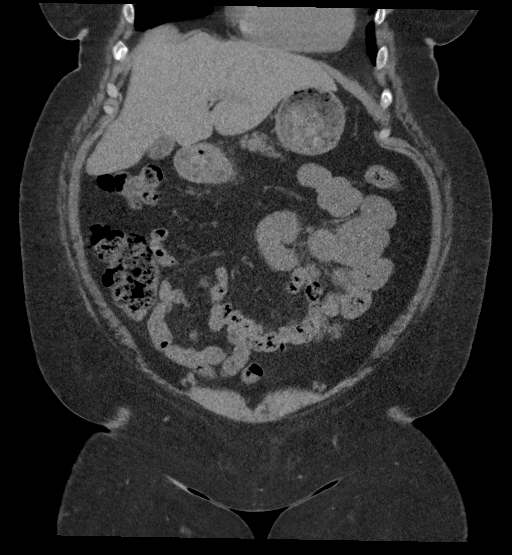
[im 56/126  soft-tissue]
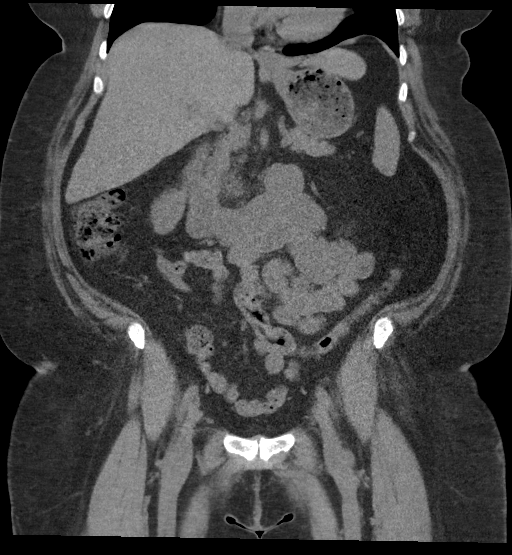
[im 70/126  soft-tissue]
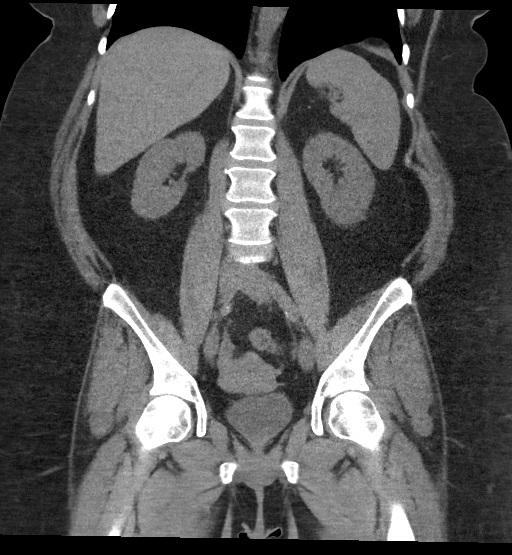

[17 of 46 positions shown; findings below may reference images not displayed]

FINDINGS: Lower chest: The lung bases are clear. The heart size is normal.

Hepatobiliary: The liver is normal. Normal gallbladder.There is no
biliary ductal dilation.

Pancreas: Normal contours without ductal dilatation. No
peripancreatic fluid collection.

Spleen: Unremarkable.

Adrenals/Urinary Tract:

--Adrenal glands: There is a stable left adrenal adenoma.

--Right kidney/ureter: No hydronephrosis or radiopaque kidney
stones.

--Left kidney/ureter: No hydronephrosis or radiopaque kidney stones.

--Urinary bladder: Unremarkable.

Stomach/Bowel:

--Stomach/Duodenum: No hiatal hernia or other gastric abnormality.
Normal duodenal course and caliber.

--Small bowel: Unremarkable.

--Colon: Unremarkable.

--Appendix: Normal.

Vascular/Lymphatic: Normal course and caliber of the major abdominal
vessels.

--No retroperitoneal lymphadenopathy.

--No mesenteric lymphadenopathy.

--No pelvic or inguinal lymphadenopathy.

Reproductive: Unremarkable

Other: No ascites or free air. There is a fat containing umbilical
hernia.

Musculoskeletal. No acute displaced fractures.
IMPRESSION: No acute abnormality. No findings to explain the patient's pain.
There are no radiopaque kidney stones.

## 2022-05-13 ENCOUNTER — Encounter (INDEPENDENT_AMBULATORY_CARE_PROVIDER_SITE_OTHER): Payer: Self-pay

## 2023-04-01 ENCOUNTER — Other Ambulatory Visit (INDEPENDENT_AMBULATORY_CARE_PROVIDER_SITE_OTHER): Payer: 59

## 2023-04-01 ENCOUNTER — Ambulatory Visit: Payer: 59 | Admitting: Orthopedic Surgery

## 2023-04-01 ENCOUNTER — Encounter: Payer: Self-pay | Admitting: Orthopedic Surgery

## 2023-04-01 DIAGNOSIS — M25562 Pain in left knee: Secondary | ICD-10-CM

## 2023-04-01 DIAGNOSIS — G8929 Other chronic pain: Secondary | ICD-10-CM

## 2023-04-01 DIAGNOSIS — M25561 Pain in right knee: Secondary | ICD-10-CM

## 2023-04-01 DIAGNOSIS — M170 Bilateral primary osteoarthritis of knee: Secondary | ICD-10-CM

## 2023-04-01 NOTE — Progress Notes (Signed)
Office Visit Note   Patient: Tiffany Young. [place]           Date of Birth: August 16, 1971           MRN: [id number] Visit Date: 04/01/2023              Requested by: Delma Officer, PA [address] [place],  [place] [address] PCP: Delma Officer, [place]  Chief Complaint  Patient presents with   Left Knee - Pain   Right Knee - Pain      HPI: Patient is a 52 year old woman with osteoarthritis bilateral knees.  She has been symptomatic for 7 to 8 years.  Pain worse in the right than the left knee.  Patient has had temporary relief from previous steroid injections.  Assessment & Plan: Visit Diagnoses:  1. Chronic pain of both knees   2. Bilateral primary osteoarthritis of knee     Plan: With the extent of arthritis discussed that the hyaluronic acid injection shots would most likely provide minimal relief.  Patient wishes to proceed with additional injection of both knees this was provided with steroid.  Patient is going to [facility]  She will follow-up for total knee arthroplasty when her BMI is below 40.  Follow-Up Instructions: No follow-ups on file.   Ortho Exam  Patient is alert, oriented, no adenopathy, well-dressed, normal affect, normal respiratory effort. Examination patient has difficulty getting from a sitting to a standing position she has an antalgic gait she has crepitation with range of motion of both knees collaterals and cruciates are stable there is no effusion.  Tender worse over the medial joint line.  Imaging: XR Knee 1-2 Views Right  Result Date: 04/01/2023 2 view radiographs of the right knee shows tricompartmental osteoarthritis with subcondylar cyst and sclerosis with medial joint line narrowing.  XR Knee 1-2 Views Left  Result Date: 04/01/2023 2 view radiographs of the left knee shows tricompartmental arthritis with bony spurs subchondral cyst and sclerosis.  There is medial joint line narrowing.   No images are attached to the encounter.  Labs: Lab Results  Component Value Date   HGBA1C 5.7 (H) 04/15/2020   REPTSTATUS 04/15/2020 FINAL 04/14/2020   CULT  04/14/2020    NO GROWTH Performed at [facility], [address], [place], [place] [address]      Lab Results  Component Value Date   ALBUMIN 3.9 03/26/2019   ALBUMIN 3.6 07/12/2016   ALBUMIN 3.6 03/02/2015    No results found for: "MG" Lab Results  Component Value Date   VD25OH 34.8 04/15/2020    No results found for: "PREALBUMIN"    Latest Ref Rng & Units 08/23/2020   10:04 AM 04/14/2020    4:20 PM 03/26/2019    2:00 PM  CBC EXTENDED  WBC 4.0 - 10.5 K/uL 4.5  5.8  5.0   RBC 3.87 - 5.11 MIL/uL 4.49  4.41  4.26   Hemoglobin 12.0 - 15.0 g/dL 25.3  66.4  40.3   HCT 36.0 - 46.0 % 42.1  42.3  39.9   Platelets 150 - 400 K/uL 258  252  259   NEUT# 1.7 - 7.7 K/uL 2.0   3.2   Lymph# 0.7 - 4.0 K/uL 2.0   1.5      There is no height or weight on file to calculate BMI.  Orders:  Orders Placed This Encounter  Procedures   XR Knee  1-2 Views Right   XR Knee 1-2 Views Left   No orders of the defined types were placed in this encounter.    Procedures: Large Joint Inj: bilateral knee on 04/01/2023 10:55 AM Indications: pain and diagnostic evaluation Details: 22 G 1.5 in needle, anteromedial approach  Arthrogram: No  Outcome: tolerated well, no immediate complications Procedure, treatment alternatives, risks and benefits explained, specific risks discussed. Consent was given by the patient. Immediately prior to procedure a time out was called to verify the correct patient, procedure, equipment, support staff and site/side marked as required. Patient was prepped and draped in the usual sterile fashion.      Clinical Data: No additional findings.  ROS:  All other systems negative, except as noted in the HPI. Review of Systems  Objective: Vital Signs: There were no vitals taken for this  visit.  Specialty Comments:  No specialty comments available.  PMFS History: Patient Active Problem List   Diagnosis Date Noted   Symptomatic mammary hypertrophy 05/28/2020   Recurrent kidney stones 04/15/2020   Morbid obesity (HCC) 04/15/2020   Hyperlipidemia 04/15/2020   Neck pain 03/01/2020   Back pain 03/01/2020   Breast wound, right, initial encounter 03/01/2020   Past Medical History:  Diagnosis Date   Carpal tunnel syndrome    GERD (gastroesophageal reflux disease)    High cholesterol    History of kidney stones    Joint pain    Knee pain, bilateral    PONV (postoperative nausea and vomiting)    After surgery in 2001   Pre-diabetes    Stomach ulcer early 2000's   Symptomatic mammary hypertrophy 03/01/2020    Family History  Problem Relation Age of Onset   Breast cancer Paternal Aunt    Breast cancer Cousin    Diabetes Mother    Stroke Mother    Kidney disease Mother    Cancer Mother    AAA (abdominal aortic aneurysm) Father     Past Surgical History:  Procedure Laterality Date   APPLICATION OF A-CELL OF CHEST/ABDOMEN Bilateral 06/03/2020   Procedure: APPLICATION OF A-CELL OF CHEST/ABDOMEN;  Surgeon: Peggye Form, DO;  Location: [facility];  Service: Plastics;  Laterality: Bilateral;   BREAST REDUCTION SURGERY Bilateral 05/09/2020   Procedure: BILATERAL BREAST REDUCTION WITH LIPOSUCTION;  Surgeon: Peggye Form, DO;  Location: [facility];  Service: Plastics;  Laterality: Bilateral;  3.5 hours, please   CARPAL TUNNEL RELEASE Left 08/27/2020   Procedure: CARPAL TUNNEL RELEASE ENDOSCOPIC;  Surgeon: Sheral Apley, MD;  Location: [facility];  Service: Orthopedics;  Laterality: Left;   INCISION AND DRAINAGE OF WOUND Bilateral 06/03/2020   Procedure: Irrigation/Debridement bilateral breast wounds;  Surgeon: Peggye Form, DO;  [facility];  Service: Plastics;  Laterality:  Bilateral;  1 hour   KNEE SURGERY Left 1995   arthroscopic   left heel spur Right 2001 oe 2002   REDUCTION MAMMAPLASTY Bilateral 05/08/2020   Social History   Occupational History   Occupation: Sports coach Admin  Tobacco Use   Smoking status: Never   Smokeless tobacco: Never  Vaping Use   Vaping Use: Never used  Substance and Sexual Activity   Alcohol use: No   Drug use: Never   Sexual activity: Not on file

## 2023-08-13 ENCOUNTER — Ambulatory Visit: Payer: 59 | Admitting: Family

## 2023-08-13 ENCOUNTER — Encounter: Payer: Self-pay | Admitting: Family

## 2023-08-13 DIAGNOSIS — M170 Bilateral primary osteoarthritis of knee: Secondary | ICD-10-CM | POA: Diagnosis not present

## 2023-08-13 MED ORDER — METHYLPREDNISOLONE ACETATE 40 MG/ML IJ SUSP
40.0000 mg | INTRAMUSCULAR | Status: AC | PRN
  Administered 2023-08-13: 40 mg via INTRA_ARTICULAR

## 2023-08-13 MED ORDER — LIDOCAINE HCL 1 % IJ SOLN
5.0000 mL | INTRAMUSCULAR | Status: AC | PRN
  Administered 2023-08-13: 5 mL

## 2023-08-13 NOTE — Progress Notes (Signed)
Office Visit Note   Patient: Tiffany Young           Date of Birth: 23-May-1971           MRN: [id number] Visit Date: 08/13/2023              Requested by: Delma Officer, PA [address] [place],  [place] [address] PCP: Delma Officer, [place]  Chief Complaint  Patient presents with   Right Knee - Pain   Left Knee - Pain      HPI: The patient is a 52 year old woman who is seen in follow-up for chronic knee pain bilateral osteoarthritis of her knees.  She has been using conservative measures attempting to put off total knee arthroplasty.  She has right worse than left knee pain.  Has had temporary relief from previous Depo Medrol injections.  Assessment & Plan: Visit Diagnoses: No diagnosis found.  Plan: Depo-Medrol injection bilateral knees.  Discussed use of anti-inflammatories orally.  She is hoping to begin [facility] soon and hopes to get her BMI down for total knee arthroplasty  Follow-Up Instructions: Return if symptoms worsen or fail to improve.   Ortho Exam  Patient is alert, oriented, no adenopathy, well-dressed, normal affect, normal respiratory effort. Crepitation with range of motion of bilateral knees.  Her collaterals and cruciates are stable bilaterally.  There is no effusion.  Bilateral medial joint line tenderness.  Imaging: No results found. No images are attached to the encounter.  Labs: Lab Results  Component Value Date   HGBA1C 5.7 (H) 04/15/2020   REPTSTATUS 04/15/2020 FINAL 04/14/2020   CULT  04/14/2020    NO GROWTH Performed at [facility], [address] [place], [place] [address]      Lab Results  Component Value Date   ALBUMIN 3.9 03/26/2019   ALBUMIN 3.6 07/12/2016   ALBUMIN 3.6 03/02/2015    No results found for: "MG" Lab Results  Component Value Date   VD25OH 34.8 04/15/2020    No results found for: "PREALBUMIN"    Latest Ref Rng & Units 08/23/2020   10:04 AM 04/14/2020    4:20 PM  03/26/2019    2:00 PM  CBC EXTENDED  WBC 4.0 - 10.5 K/uL 4.5  5.8  5.0   RBC 3.87 - 5.11 MIL/uL 4.49  4.41  4.26   Hemoglobin 12.0 - 15.0 g/dL 85.4  62.7  03.5   HCT 36.0 - 46.0 % 42.1  42.3  39.9   Platelets 150 - 400 K/uL 258  252  259   NEUT# 1.7 - 7.7 K/uL 2.0   3.2   Lymph# 0.7 - 4.0 K/uL 2.0   1.5      There is no height or weight on file to calculate BMI.  Orders:  No orders of the defined types were placed in this encounter.  No orders of the defined types were placed in this encounter.    Procedures: Large Joint Inj: bilateral knee on 08/13/2023 9:28 AM Indications: pain Details: 18 G 1.5 in needle, anteromedial approach Medications (Right): 5 mL lidocaine 1 %; 40 mg methylPREDNISolone acetate 40 MG/ML Medications (Left): 5 mL lidocaine 1 %; 40 mg methylPREDNISolone acetate 40 MG/ML Consent was given by the patient.      Clinical Data: No additional findings.  ROS:  All other systems negative, except as noted in the HPI. Review of Systems  Objective: Vital Signs: There were no vitals taken for this visit.  Specialty Comments:  No specialty comments available.  PMFS History: Patient Active Problem List   Diagnosis Date Noted   Symptomatic mammary hypertrophy 05/28/2020   Recurrent kidney stones 04/15/2020   Morbid obesity (HCC) 04/15/2020   Hyperlipidemia 04/15/2020   Neck pain 03/01/2020   Back pain 03/01/2020   Breast wound, right, initial encounter 03/01/2020   Past Medical History:  Diagnosis Date   Carpal tunnel syndrome    GERD (gastroesophageal reflux disease)    High cholesterol    History of kidney stones    Joint pain    Knee pain, bilateral    PONV (postoperative nausea and vomiting)    After surgery in 2001   Pre-diabetes    Stomach ulcer early 2000's   Symptomatic mammary hypertrophy 03/01/2020    Family History  Problem Relation Age of Onset   Breast cancer Paternal Aunt    Breast cancer Cousin    Diabetes Mother    Stroke  Mother    Kidney disease Mother    Cancer Mother    AAA (abdominal aortic aneurysm) Father     Past Surgical History:  Procedure Laterality Date   APPLICATION OF A-CELL OF CHEST/ABDOMEN Bilateral 06/03/2020   Procedure: APPLICATION OF A-CELL OF CHEST/ABDOMEN;  Surgeon: Peggye Form, DO;  Location: [facility];  Service: Plastics;  Laterality: Bilateral;   BREAST REDUCTION SURGERY Bilateral 05/09/2020   Procedure: BILATERAL BREAST REDUCTION WITH LIPOSUCTION;  Surgeon: Peggye Form, DO;  Location: [facility];  Service: Plastics;  Laterality: Bilateral;  3.5 hours, please   CARPAL TUNNEL RELEASE Left 08/27/2020   Procedure: CARPAL TUNNEL RELEASE ENDOSCOPIC;  Surgeon: Sheral Apley, MD;  Location: [facility];  Service: Orthopedics;  Laterality: Left;   INCISION AND DRAINAGE OF WOUND Bilateral 06/03/2020   Procedure: Irrigation/Debridement bilateral breast wounds;  Surgeon: Peggye Form, DO;  Location: [facility];  Service: Plastics;  Laterality: Bilateral;  1 hour   KNEE SURGERY Left 1995   arthroscopic   left heel spur Right 2001 oe 2002   REDUCTION MAMMAPLASTY Bilateral 05/08/2020   Social History   Occupational History   Occupation: Sports coach Admin  Tobacco Use   Smoking status: Never   Smokeless tobacco: Never  Vaping Use   Vaping status: Never Used  Substance and Sexual Activity   Alcohol use: No   Drug use: Never   Sexual activity: Not on file

## 2023-08-26 ENCOUNTER — Ambulatory Visit: Payer: 59 | Admitting: Family Medicine

## 2023-09-20 ENCOUNTER — Other Ambulatory Visit: Payer: Self-pay

## 2023-09-20 ENCOUNTER — Emergency Department (HOSPITAL_COMMUNITY)
Admission: EM | Admit: 2023-09-20 | Discharge: 2023-09-20 | Disposition: A | Discharge status: Home / Self Care | Payer: 59 | Attending: Emergency Medicine | Admitting: Emergency Medicine

## 2023-09-20 ENCOUNTER — Emergency Department (HOSPITAL_COMMUNITY): Payer: 59

## 2023-09-20 DIAGNOSIS — N201 Calculus of ureter: Secondary | ICD-10-CM | POA: Insufficient documentation

## 2023-09-20 DIAGNOSIS — R109 Unspecified abdominal pain: Secondary | ICD-10-CM | POA: Diagnosis present

## 2023-09-20 LAB — CBC WITH DIFFERENTIAL/PLATELET
Abs Immature Granulocytes: 0.02 10*3/uL (ref 0.00–0.07)
Basophils Absolute: 0.1 10*3/uL (ref 0.0–0.1)
Basophils Relative: 1 %
Eosinophils Absolute: 0.1 10*3/uL (ref 0.0–0.5)
Eosinophils Relative: 1 %
HCT: 39.5 % (ref 36.0–46.0)
Hemoglobin: 13 g/dL (ref 12.0–15.0)
Immature Granulocytes: 0 %
Lymphocytes Relative: 18 %
Lymphs Abs: 1.5 10*3/uL (ref 0.7–4.0)
MCH: 31.5 pg (ref 26.0–34.0)
MCHC: 32.9 g/dL (ref 30.0–36.0)
MCV: 95.6 fL (ref 80.0–100.0)
Monocytes Absolute: 0.7 10*3/uL (ref 0.1–1.0)
Monocytes Relative: 8 %
Neutro Abs: 6.3 10*3/uL (ref 1.7–7.7)
Neutrophils Relative %: 72 %
Platelets: 228 10*3/uL (ref 150–400)
RBC: 4.13 MIL/uL (ref 3.87–5.11)
RDW: 13.2 % (ref 11.5–15.5)
WBC: 8.7 10*3/uL (ref 4.0–10.5)
nRBC: 0 % (ref 0.0–0.2)

## 2023-09-20 LAB — BASIC METABOLIC PANEL
Anion gap: 12 (ref 5–15)
BUN: 18 mg/dL (ref 6–20)
CO2: 25 mmol/L (ref 22–32)
Calcium: 9.3 mg/dL (ref 8.9–10.3)
Chloride: 102 mmol/L (ref 98–111)
Creatinine, Ser: 1.06 mg/dL — ABNORMAL HIGH (ref 0.44–1.00)
GFR, Estimated: >60 mL/min (ref >60)
Glucose, Bld: 137 mg/dL — ABNORMAL HIGH (ref 70–99)
Potassium: 4.4 mmol/L (ref 3.5–5.1)
Sodium: 139 mmol/L (ref 135–145)

## 2023-09-20 LAB — URINALYSIS, ROUTINE W REFLEX MICROSCOPIC
Bilirubin Urine: NEGATIVE
Glucose, UA: NEGATIVE mg/dL
Hgb urine dipstick: NEGATIVE
Ketones, ur: NEGATIVE mg/dL
Leukocytes,Ua: NEGATIVE
Nitrite: NEGATIVE
Protein, ur: NEGATIVE mg/dL
Specific Gravity, Urine: 1.025 (ref 1.005–1.030)
pH: 5 (ref 5.0–8.0)

## 2023-09-20 MED ORDER — ONDANSETRON HCL 4 MG/2ML IJ SOLN
4.0000 mg | Freq: Once | INTRAMUSCULAR | Status: AC
  Administered 2023-09-20: 4 mg via INTRAVENOUS
  Filled 2023-09-20: qty 2

## 2023-09-20 MED ORDER — KETOROLAC TROMETHAMINE 30 MG/ML IJ SOLN
15.0000 mg | Freq: Once | INTRAMUSCULAR | Status: AC
  Administered 2023-09-20: 15 mg via INTRAVENOUS
  Filled 2023-09-20: qty 1

## 2023-09-20 MED ORDER — FENTANYL CITRATE PF 50 MCG/ML IJ SOSY
50.0000 ug | PREFILLED_SYRINGE | Freq: Once | INTRAMUSCULAR | Status: AC
  Administered 2023-09-20: 50 ug via INTRAVENOUS
  Filled 2023-09-20: qty 1

## 2023-09-20 MED ORDER — SODIUM CHLORIDE 0.9 % IV BOLUS
1000.0000 mL | Freq: Once | INTRAVENOUS | Status: AC
  Administered 2023-09-20: 1000 mL via INTRAVENOUS

## 2023-09-20 NOTE — ED Triage Notes (Signed)
Pt c/o right sided flank pain that began on Friday, states pain has gotten worse now having N/V and is unable to fully empty bladder.   Hx of kidney stones

## 2023-09-20 NOTE — ED Provider Notes (Signed)
[place] EMERGENCY DEPARTMENT AT [facility]  Provider Note  CSN: [id number] Arrival date & time: 09/20/23 0234  History Chief Complaint  Patient presents with   Flank Pain    Elon Jester C. Garczynski is a 52 y.o. female with remote history of renal stones reports intermittent right flank pain for the last 2 days, initially tolerable but more severe tonight and associated with nausea and vomiting. She has tried taking hydrocodone and flomax without much improvement. Some urinary hesitancy. No dysuria or fevers.    Home Medications Prior to Admission medications   Medication Sig Start Date End Date Taking? Authorizing Provider  amoxicillin-clavulanate (AUGMENTIN) 875-125 MG tablet Take 1 tablet by mouth every 12 (twelve) hours. 09/28/20   Caccavale, Sophia, PA-C  atorvastatin (LIPITOR) 20 MG tablet Take 20 mg by mouth daily. 01/03/20   [provider]  Biotin 30160 MCG TABS Take by mouth.    [provider]  cephALEXin (KEFLEX) 500 MG capsule  07/26/20   [provider]  cetirizine (ZYRTEC) 10 MG tablet Take 10 mg by mouth daily.    [provider]  cyclobenzaprine (FLEXERIL) 10 MG tablet Take 1 tablet by mouth 3 times daily as needed for muscle spasm. Warning: May cause drowsiness. 02/06/21   Mardella Layman, MD  fluconazole (DIFLUCAN) 150 MG tablet Take 1 tablet (150 mg total) by mouth daily. Take one tablet as needed for signs of a yeast infection after finishing all of the antibiotics. 09/28/20   Caccavale, Sophia, PA-C  ibuprofen (ADVIL) 800 MG tablet Take 1 tablet (800 mg total) by mouth 3 (three) times daily with meals. 02/06/21   Mardella Layman, MD  lidocaine (XYLOCAINE) 2 % solution Use as directed 15 mLs in the mouth or throat as needed for mouth pain. 09/28/20   Caccavale, Sophia, PA-C  Multiple Vitamins-Minerals (MULTIVITAMIN WITH MINERALS) tablet Take 1 tablet by mouth daily.    [provider]  omeprazole (PRILOSEC) 20 MG capsule  Take 20 mg by mouth as needed.     [provider]     Allergies    Patient has no known allergies.   Review of Systems   Review of Systems Please see HPI for pertinent positives and negatives  Physical Exam BP (!) 140/81   Pulse 87   Temp 98.5 F (36.9 C) (Oral)   Resp 19   LMP 10/02/2019 (Approximate) Comment: upt neg dos  SpO2 95%   Physical Exam Vitals and nursing note reviewed.  Constitutional:      Appearance: Normal appearance.  HENT:     Head: Normocephalic and atraumatic.     Nose: Nose normal.     Mouth/Throat:     Mouth: Mucous membranes are moist.  Eyes:     Extraocular Movements: Extraocular movements intact.     Conjunctiva/sclera: Conjunctivae normal.  Cardiovascular:     Rate and Rhythm: Normal rate.  Pulmonary:     Effort: Pulmonary effort is normal.     Breath sounds: Normal breath sounds.  Abdominal:     General: Abdomen is flat.     Palpations: Abdomen is soft.     Tenderness: There is no abdominal tenderness.  Musculoskeletal:        General: No swelling. Normal range of motion.     Cervical back: Neck supple.  Skin:    General: Skin is warm and dry.  Neurological:     General: No focal deficit present.     Mental Status: She is alert.  Psychiatric:        Mood and Affect: Mood normal.     ED Results / Procedures / Treatments   EKG None  Procedures Procedures  Medications Ordered in the ED Medications  fentaNYL (SUBLIMAZE) injection 50 mcg (50 mcg Intravenous Given 09/20/23 0301)  ondansetron (ZOFRAN) injection 4 mg (4 mg Intravenous Given 09/20/23 0301)  ketorolac (TORADOL) 30 MG/ML injection 15 mg (15 mg Intravenous Given 09/20/23 0341)  sodium chloride 0.9 % bolus 1,000 mL (0 mLs Intravenous Stopped 09/20/23 0523)    Initial Impression and Plan  Patient here with symptoms most consistent with renal colic. Will check labs and send for CT. Pain/nausea meds for comfort.   ED Course   Clinical Course as of  09/20/23 0523  Mon Sep 20, 2023  0319 CBC is normal.  [CS]  0331 BMP is unremarkable.  [CS]  [id number] I personally viewed the images from radiology studies and agree with radiologist interpretation: CT with distal 5mm stone.  [CS]  9147 Patient reports pain is improved. She has Motrin, hydrocodone and flomax at home from previous Rx. Recommend she strain her urine, take medications as prescribed. Urology follow up, RTED for other concerns.  [CS]    Clinical Course User Index [CS] Pollyann Savoy, MD     MDM Rules/Calculators/A&P Medical Decision Making Problems Addressed: Ureterolithiasis: acute illness or injury  Amount and/or Complexity of Data Reviewed Labs: ordered. Decision-making details documented in ED Course. Radiology: ordered and independent interpretation performed. Decision-making details documented in ED Course.  Risk Prescription drug management. Parenteral controlled substances.     Final Clinical Impression(s) / ED Diagnoses Final diagnoses:  Ureterolithiasis    Rx / DC Orders ED Discharge Orders     None        Pollyann Savoy, MD 09/20/23 [phone]

## 2023-09-20 NOTE — ED Notes (Signed)
Patient transported to CT 

## 2023-10-21 ENCOUNTER — Other Ambulatory Visit: Payer: Self-pay | Admitting: Physician Assistant

## 2023-10-21 DIAGNOSIS — E559 Vitamin D deficiency, unspecified: Secondary | ICD-10-CM | POA: Diagnosis not present

## 2023-10-21 DIAGNOSIS — Z1231 Encounter for screening mammogram for malignant neoplasm of breast: Secondary | ICD-10-CM

## 2023-10-21 DIAGNOSIS — E7800 Pure hypercholesterolemia, unspecified: Secondary | ICD-10-CM | POA: Diagnosis not present

## 2023-10-21 DIAGNOSIS — I2510 Atherosclerotic heart disease of native coronary artery without angina pectoris: Secondary | ICD-10-CM | POA: Diagnosis not present

## 2023-11-01 DIAGNOSIS — H9201 Otalgia, right ear: Secondary | ICD-10-CM | POA: Diagnosis not present

## 2023-11-08 ENCOUNTER — Ambulatory Visit: Payer: 59

## 2023-11-22 ENCOUNTER — Ambulatory Visit: Payer: 59

## 2023-12-13 ENCOUNTER — Ambulatory Visit
Admission: RE | Admit: 2023-12-13 | Discharge: 2023-12-13 | Disposition: A | Discharge status: Home / Self Care | Payer: 59 | Source: Ambulatory Visit | Attending: Physician Assistant | Admitting: Physician Assistant

## 2023-12-13 DIAGNOSIS — Z1231 Encounter for screening mammogram for malignant neoplasm of breast: Secondary | ICD-10-CM | POA: Diagnosis not present

## 2023-12-28 ENCOUNTER — Encounter: Payer: Self-pay | Admitting: Internal Medicine

## 2024-02-08 DIAGNOSIS — M170 Bilateral primary osteoarthritis of knee: Secondary | ICD-10-CM | POA: Diagnosis not present

## 2024-02-08 DIAGNOSIS — M5431 Sciatica, right side: Secondary | ICD-10-CM | POA: Diagnosis not present

## 2024-02-14 ENCOUNTER — Ambulatory Visit (AMBULATORY_SURGERY_CENTER)

## 2024-02-14 ENCOUNTER — Encounter: Payer: Self-pay | Admitting: Internal Medicine

## 2024-02-14 ENCOUNTER — Ambulatory Visit (HOSPITAL_BASED_OUTPATIENT_CLINIC_OR_DEPARTMENT_OTHER): Payer: 59 | Admitting: Cardiology

## 2024-02-14 ENCOUNTER — Encounter (HOSPITAL_BASED_OUTPATIENT_CLINIC_OR_DEPARTMENT_OTHER): Payer: Self-pay | Admitting: Cardiology

## 2024-02-14 VITALS — Ht 65.0 in | Wt 295.0 lb

## 2024-02-14 VITALS — BP 136/82 | HR 107 | Ht 65.0 in | Wt 299.0 lb

## 2024-02-14 DIAGNOSIS — Z7189 Other specified counseling: Secondary | ICD-10-CM | POA: Diagnosis not present

## 2024-02-14 DIAGNOSIS — I700 Atherosclerosis of aorta: Secondary | ICD-10-CM

## 2024-02-14 DIAGNOSIS — Z1211 Encounter for screening for malignant neoplasm of colon: Secondary | ICD-10-CM

## 2024-02-14 DIAGNOSIS — E782 Mixed hyperlipidemia: Secondary | ICD-10-CM | POA: Diagnosis not present

## 2024-02-14 DIAGNOSIS — I2510 Atherosclerotic heart disease of native coronary artery without angina pectoris: Secondary | ICD-10-CM | POA: Diagnosis not present

## 2024-02-14 MED ORDER — SUTAB 1479-225-188 MG PO TABS: 12.0000 | ORAL_TABLET | ORAL | 0 refills | Status: DC

## 2024-02-14 NOTE — Progress Notes (Signed)

## 2024-02-14 NOTE — Progress Notes (Signed)
 Cardiology Office Note:  .   Date:  02/14/2024  ID:  Tiffany Young 18-Jul-1971, MRN [id number] PCP: Karalee Oscar, PA  [facility] Providers Cardiologist:  Sheryle Donning, MD {  History of Present Illness: Tiffany Young   Tiffany Young is a 53 y.o. female with PMH hyperlipidemia, coronary calcification, morbid obesity seen as a new consult at the request of Patra Bonnet   Today: Reviewed referral from 10/22/23. Noted to have coronary calcification on CT, referred for further evaluation. Per note, was on fish oil and atorvastatin in the past but was not current taking at the time of her visit, was restarted at that time. Labs 10/2023 (off meds) showed Tchol 283, TG 227, HDL 41, calc LDL 198. I cannot see recent CT, but CT chest 2022 noted aortic atherosclerosis and focal LAD calcification.  She is overall doing well. Planning to start with [employer] to work on weight loss. BMI today 49.76. Aiming to lose about 120 lbs. Has been very active typically, but has been having more knee pain which limits her. Has already made diet changes, working to continue this.  We reviewed coronary calcification, plaque, CV risk, and recommended management, see below. She was nervous about statins, discussed the benefits for this, she is willing to retry.  Discussed diet. Also discussed exercise, she was very active at the gym with prior weight loss, working to continue cardiovascular and weight bearing exercise as best she can.   ROS: Denies chest pain, shortness of breath at rest or with normal exertion. No PND, orthopnea, LE edema or unexpected weight gain. No syncope or palpitations. ROS otherwise negative except as noted.   Studies Reviewed: Tiffany Young    EKG:  EKG Interpretation Date/Time:  Monday Feb 14 2024 13:37:06 EDT Ventricular Rate:  103 PR Interval:  140 QRS Duration:  78 QT Interval:  328 QTC Calculation: 429 R Axis:   61  Text Interpretation: Sinus tachycardia Low  voltage QRS Confirmed by Sheryle Donning [phone]) on 02/14/2024 2:07:37 PM    Physical Exam:   VS:  BP 136/82   Pulse (!) 107   Ht 5\' 5"  (1.651 m)   Wt 299 lb (135.6 kg)   LMP 10/02/2019 (Approximate) Comment: upt neg dos  SpO2 100%   BMI 49.76 kg/m    Wt Readings from Last 3 Encounters:  02/14/24 299 lb (135.6 kg)  02/14/24 295 lb (133.8 kg)  02/17/21 272 lb (123.4 kg)    GEN: Well nourished, well developed in no acute distress HEENT: Normal, moist mucous membranes NECK: No JVD CARDIAC: regular rhythm, normal S1 and S2, no rubs or gallops. No murmur. VASCULAR: Radial and DP pulses 2+ bilaterally. No carotid bruits RESPIRATORY:  Clear to auscultation without rales, wheezing or rhonchi  ABDOMEN: Soft, non-tender, non-distended MUSCULOSKELETAL:  Ambulates independently SKIN: Warm and dry, no edema NEUROLOGIC:  Alert and oriented x 3. No focal neuro deficits noted. PSYCHIATRIC:  Normal affect    ASSESSMENT AND PLAN: .    Coronary calcification Aortic atherosclerosis Mixed hyperlipidemia Obesity, BMI ~50 -We discussed the pathophysiology of cholesterol plaque formation, the role of calcium and why it is a marker, how plaque is key to acute MI/CVA, and how known plaque is managed with medications.   -we discussed the data on statins, both in terms of their long term benefit as well as the risk of side effects. Reviewed common misconceptions about statins. Reviewed how we monitor treatment. After shared decision making, patient is agreeable to  trialing statin. She is going to restart the rosuvastatin she has at home. She has tolerated atorvastatin and rosuvastatin in the past. She will recheck lipids with her PCP in 2-3 months.  -discussed guidelines recommending aspirin. She is on this and tolerating, continue -reviewed red flag warning signs that need immediate medical attention  -discussed diet recommendations, artificial sweeteners vs full sugars -LDL goal <70  CV risk  counseling and prevention -recommend heart healthy/Mediterranean diet, with whole grains, fruits, vegetable, fish, lean meats, nuts, and olive oil. Limit salt. -recommend moderate walking, 3-5 times/week for 30-50 minutes each session. Aim for at least 150 minutes.week. Goal should be pace of 3 miles/hours, or walking 1.5 miles in 30 minutes -recommend avoidance of tobacco products. Avoid excess alcohol.  Dispo: 1 year or sooner as needed  Signed, Sheryle Donning, MD   Sheryle Donning, MD, PhD, [facility]  [facility]  [facility] at [facility] [address], [address] [place], [place] [address] [phone]

## 2024-02-14 NOTE — Patient Instructions (Signed)
 Medication Instructions:  Your physician recommends that you continue on your current medications as directed. Please refer to the Current Medication list given to you today.   Follow-Up: Please follow up in 12 months with Dr. Veryl Gottron, Slater Duncan, NP or Neomi Banks, NP

## 2024-03-06 ENCOUNTER — Ambulatory Visit (AMBULATORY_SURGERY_CENTER): Admitting: Internal Medicine

## 2024-03-06 ENCOUNTER — Encounter: Payer: Self-pay | Admitting: Internal Medicine

## 2024-03-06 VITALS — BP 116/83 | HR 81 | Temp 98.0°F | Resp 18 | Ht 65.0 in | Wt 295.0 lb

## 2024-03-06 DIAGNOSIS — D128 Benign neoplasm of rectum: Secondary | ICD-10-CM | POA: Diagnosis not present

## 2024-03-06 DIAGNOSIS — Z1211 Encounter for screening for malignant neoplasm of colon: Secondary | ICD-10-CM

## 2024-03-06 DIAGNOSIS — E7800 Pure hypercholesterolemia, unspecified: Secondary | ICD-10-CM | POA: Diagnosis not present

## 2024-03-06 DIAGNOSIS — R7303 Prediabetes: Secondary | ICD-10-CM | POA: Diagnosis not present

## 2024-03-06 MED ORDER — SODIUM CHLORIDE 0.9 % IV SOLN: 500.0000 mL | Freq: Once | INTRAVENOUS | Status: AC

## 2024-03-06 NOTE — Progress Notes (Signed)
 Report to PACU, RN, vss, BBS= Clear.

## 2024-03-06 NOTE — Progress Notes (Signed)
 Called to room to assist during endoscopic procedure.  Patient ID and intended procedure confirmed with present staff. Received instructions for my participation in the procedure from the performing physician.

## 2024-03-06 NOTE — Progress Notes (Signed)
 Pt's states no medical or surgical changes since previsit or office visit.

## 2024-03-06 NOTE — Op Note (Signed)
 [facility] Patient Name: Tiffany Young Procedure Date: 03/06/2024 10:55 AM MRN: [id number] Endoscopist: Murel Arlington. Elvin Hammer , MD, [id number] Age: 53 Referring MD:  Date of Birth: 07/10/1971 Gender: Female Account #: [id number] Procedure:                Colonoscopy with cold snare polypectomy x 1 Indications:              Screening for colorectal malignant neoplasm Medicines:                Monitored Anesthesia Care Procedure:                Pre-Anesthesia Assessment:                           - Prior to the procedure, a History and Physical                            was performed, and patient medications and                            allergies were reviewed. The patient's tolerance of                            previous anesthesia was also reviewed. The risks                            and benefits of the procedure and the sedation                            options and risks were discussed with the patient.                            All questions were answered, and informed consent                            was obtained. Prior Anticoagulants: The patient has                            taken no anticoagulant or antiplatelet agents. ASA                            Grade Assessment: II - A patient with mild systemic                            disease. After reviewing the risks and benefits,                            the patient was deemed in satisfactory condition to                            undergo the procedure.                           After obtaining informed consent, the colonoscope  was passed under direct vision. Throughout the                            procedure, the patient's blood pressure, pulse, and                            oxygen saturations were monitored continuously. The                            Olympus Scope SN [id number] was introduced through the                            anus and advanced to the the cecum, identified by                             appendiceal orifice and ileocecal valve. The                            ileocecal valve, appendiceal orifice, and rectum                            were photographed. The quality of the bowel                            preparation was excellent. The colonoscopy was                            performed without difficulty. The patient tolerated                            the procedure well. The bowel preparation used was                            SUPREP via split dose instruction. Scope In: 11:10:46 AM Scope Out: 11:23:29 AM Scope Withdrawal Time: 0 hours 11 minutes 26 seconds  Total Procedure Duration: 0 hours 12 minutes 43 seconds  Findings:                 A 5 mm polyp was found in the rectum. The polyp was                            removed with a cold snare. Resection and retrieval                            were complete.                           The exam was otherwise without abnormality on                            direct and retroflexion views. Complications:            No immediate complications. Estimated blood loss:  None. Estimated Blood Loss:     Estimated blood loss: none. Impression:               - One 5 mm polyp in the rectum, removed with a cold                            snare. Resected and retrieved.                           - The examination was otherwise normal on direct                            and retroflexion views. Recommendation:           - Repeat colonoscopy in 7-10 years for surveillance.                           - Patient has a contact number available for                            emergencies. The signs and symptoms of potential                            delayed complications were discussed with the                            patient. Return to normal activities tomorrow.                            Written discharge instructions were provided to the                            patient.                            - Resume previous diet.                           - Continue present medications.                           - Await pathology results. Murel Arlington. Elvin Hammer, MD 03/06/2024 11:30:18 AM This report has been signed electronically.

## 2024-03-06 NOTE — Progress Notes (Signed)
 HISTORY OF PRESENT ILLNESS:  Tiffany Young. Tiffany Young is a 53 y.o. female who was sent today direct for screening colonoscopy.  No complaints  REVIEW OF SYSTEMS:  All non-GI ROS negative except for  Past Medical History:  Diagnosis Date   Carpal tunnel syndrome    GERD (gastroesophageal reflux disease)    High cholesterol    History of kidney stones    Joint pain    Knee pain, bilateral    PONV (postoperative nausea and vomiting)    After surgery in 2001   Pre-diabetes    Stomach ulcer early 2000's   Symptomatic mammary hypertrophy 03/01/2020    Past Surgical History:  Procedure Laterality Date   APPLICATION OF A-CELL OF CHEST/ABDOMEN Bilateral 06/03/2020   Procedure: APPLICATION OF A-CELL OF CHEST/ABDOMEN;  Surgeon: Thornell Flirt, DO;  Location: [facility];  Service: Plastics;  Laterality: Bilateral;   BREAST REDUCTION SURGERY Bilateral 05/09/2020   Procedure: BILATERAL BREAST REDUCTION WITH LIPOSUCTION;  Surgeon: Thornell Flirt, DO;  Location: [facility];  Service: Plastics;  Laterality: Bilateral;  3.5 hours, please   CARPAL TUNNEL RELEASE Left 08/27/2020   Procedure: CARPAL TUNNEL RELEASE ENDOSCOPIC;  Surgeon: Saundra Curl, MD;  Location[facility];  Service: Orthopedics;  Laterality: Left;   INCISION AND DRAINAGE OF WOUND Bilateral 06/03/2020   Procedure: Irrigation/Debridement bilateral breast wounds;  Surgeon: Thornell Flirt, DO;  Location: [facility];  Service: Plastics;  Laterality: Bilateral;  1 hour   KNEE SURGERY Left 1995   arthroscopic   left heel spur Right 2001 oe 2002   REDUCTION MAMMAPLASTY Bilateral 05/08/2020    Social History Lonia C. Norell  reports that she has never smoked. She has never used smokeless tobacco. She reports that she does not drink alcohol and does not use drugs.  family history includes AAA (abdominal aortic aneurysm) in her father; Breast cancer in  her cousin and paternal aunt; Cancer in her mother; Diabetes in her mother; Kidney disease in her mother; Stroke in her mother.  No Known Allergies     PHYSICAL EXAMINATION: Vital signs: BP (!) 151/91   Pulse 96   Temp 98 F (36.7 C)   Resp 12   Ht 5\' 5"  (1.651 m)   Wt 295 lb (133.8 kg)   LMP 10/02/2019 (Approximate) Comment: upt neg dos  SpO2 99%   BMI 49.09 kg/m  General: Well-developed, well-nourished, no acute distress HEENT: Sclerae are anicteric, conjunctiva pink. Oral mucosa intact Lungs: Clear Heart: Regular Abdomen: soft, nontender, nondistended, no obvious ascites, no peritoneal signs, normal bowel sounds. No organomegaly. Extremities: No edema Psychiatric: alert and oriented x3. Cooperative     ASSESSMENT:  Colon cancer screening   PLAN:  Screening colonoscopy

## 2024-03-06 NOTE — Patient Instructions (Signed)
  Resume previous diet Continue present medications Await pathology results Handout given on polyps   YOU HAD AN ENDOSCOPIC PROCEDURE TODAY AT THE [facility]:   Refer to the procedure report that was given to you for any specific questions about what was found during the examination.  If the procedure report does not answer your questions, please call your gastroenterologist to clarify.  If you requested that your care partner not be given the details of your procedure findings, then the procedure report has been included in a sealed envelope for you to review at your convenience later.  YOU SHOULD EXPECT: Some feelings of bloating in the abdomen. Passage of more gas than usual.  Walking can help get rid of the air that was put into your GI tract during the procedure and reduce the bloating. If you had a lower endoscopy (such as a colonoscopy or flexible sigmoidoscopy) you may notice spotting of blood in your stool or on the toilet paper. If you underwent a bowel prep for your procedure, you may not have a normal bowel movement for a few days.  Please Note:  You might notice some irritation and congestion in your nose or some drainage.  This is from the oxygen used during your procedure.  There is no need for concern and it should clear up in a day or so.  SYMPTOMS TO REPORT IMMEDIATELY:  Following lower endoscopy (colonoscopy or flexible sigmoidoscopy):  Excessive amounts of blood in the stool  Significant tenderness or worsening of abdominal pains  Swelling of the abdomen that is new, acute  Fever of 100F or higher   For urgent or emergent issues, a gastroenterologist can be reached at any hour by calling [phone]. Do not use MyChart messaging for urgent concerns.    DIET:  We do recommend a small meal at first, but then you may proceed to your regular diet.  Drink plenty of fluids but you should avoid alcoholic beverages for 24 hours.  ACTIVITY:  You should plan to  take it easy for the rest of today and you should NOT DRIVE or use heavy machinery until tomorrow (because of the sedation medicines used during the test).    FOLLOW UP: Our staff will call the number listed on your records the next business day following your procedure.  We will call around 7:15- 8:00 am to check on you and address any questions or concerns that you may have regarding the information given to you following your procedure. If we do not reach you, we will leave a message.     If any biopsies were taken you will be contacted by phone or by letter within the next 1-3 weeks.  Please call us  at [phone] if you have not heard about the biopsies in 3 weeks.    SIGNATURES/CONFIDENTIALITY: You and/or your care partner have signed paperwork which will be entered into your electronic medical record.  These signatures attest to the fact that that the information above on your After Visit Summary has been reviewed and is understood.  Full responsibility of the confidentiality of this discharge information lies with you and/or your care-partner.

## 2024-03-07 ENCOUNTER — Telehealth: Payer: Self-pay | Admitting: Lactation Services

## 2024-03-07 NOTE — Telephone Encounter (Signed)
  Follow up Call-     03/06/2024   10:40 AM  Call back number  Post procedure Call Back phone  # [phone]  Permission to leave phone message Yes     Patient questions:  Do you have a fever, pain , or abdominal swelling? No. Pain Score  0 *  Have you tolerated food without any problems? Yes.    Have you been able to return to your normal activities? Yes.    Do you have any questions about your discharge instructions: Diet   No. Medications  No. Follow up visit  No.  Do you have questions or concerns about your Care? No.  Actions: * If pain score is 4 or above: No action needed, pain <4.

## 2024-03-09 ENCOUNTER — Ambulatory Visit: Payer: Self-pay | Admitting: Internal Medicine

## 2024-03-09 LAB — SURGICAL PATHOLOGY

## 2024-04-13 DIAGNOSIS — M170 Bilateral primary osteoarthritis of knee: Secondary | ICD-10-CM | POA: Diagnosis not present

## 2024-08-02 ENCOUNTER — Telehealth: Payer: Self-pay | Admitting: Cardiology

## 2024-08-02 NOTE — Telephone Encounter (Signed)
 Left message to call back.

## 2024-08-02 NOTE — Telephone Encounter (Signed)
 Pt c/o Shortness Of Breath: STAT if SOB developed within the last 24 hours or pt is noticeably SOB on the phone  1. Are you currently SOB (can you hear that pt is SOB on the phone)? no  2. How long have you been experiencing SOB? Last couple of weeks  3. Are you SOB when sitting or when up moving around?  Moving around   4. Are you currently experiencing any other symptoms? Sharp pain every once in a while, indigestion

## 2024-08-04 ENCOUNTER — Other Ambulatory Visit: Payer: Self-pay

## 2024-08-04 ENCOUNTER — Emergency Department (HOSPITAL_BASED_OUTPATIENT_CLINIC_OR_DEPARTMENT_OTHER)

## 2024-08-04 ENCOUNTER — Emergency Department (HOSPITAL_BASED_OUTPATIENT_CLINIC_OR_DEPARTMENT_OTHER)
Admission: EM | Admit: 2024-08-04 | Discharge: 2024-08-04 | Disposition: A | Discharge status: Home / Self Care | Attending: Emergency Medicine | Admitting: Emergency Medicine

## 2024-08-04 DIAGNOSIS — N132 Hydronephrosis with renal and ureteral calculous obstruction: Secondary | ICD-10-CM | POA: Diagnosis not present

## 2024-08-04 DIAGNOSIS — R1031 Right lower quadrant pain: Secondary | ICD-10-CM | POA: Insufficient documentation

## 2024-08-04 DIAGNOSIS — N201 Calculus of ureter: Secondary | ICD-10-CM

## 2024-08-04 DIAGNOSIS — Z7982 Long term (current) use of aspirin: Secondary | ICD-10-CM | POA: Diagnosis not present

## 2024-08-04 DIAGNOSIS — K429 Umbilical hernia without obstruction or gangrene: Secondary | ICD-10-CM | POA: Diagnosis not present

## 2024-08-04 LAB — COMPREHENSIVE METABOLIC PANEL WITH GFR
ALT: 31 U/L (ref 0–44)
AST: 28 U/L (ref 15–41)
Albumin: 4.4 g/dL (ref 3.5–5.0)
Alkaline Phosphatase: 62 U/L (ref 38–126)
Anion gap: 9 (ref 5–15)
BUN: 12 mg/dL (ref 6–20)
CO2: 28 mmol/L (ref 22–32)
Calcium: 10.2 mg/dL (ref 8.9–10.3)
Chloride: 105 mmol/L (ref 98–111)
Creatinine, Ser: 0.73 mg/dL (ref 0.44–1.00)
GFR, Estimated: >60 mL/min (ref >60)
Glucose, Bld: 102 mg/dL — ABNORMAL HIGH (ref 70–99)
Potassium: 4.4 mmol/L (ref 3.5–5.1)
Sodium: 142 mmol/L (ref 135–145)
Total Bilirubin: 0.4 mg/dL (ref 0.0–1.2)
Total Protein: 7.5 g/dL (ref 6.5–8.1)

## 2024-08-04 LAB — CBC
HCT: 41.7 % (ref 36.0–46.0)
Hemoglobin: 13.9 g/dL (ref 12.0–15.0)
MCH: 31.4 pg (ref 26.0–34.0)
MCHC: 33.3 g/dL (ref 30.0–36.0)
MCV: 94.3 fL (ref 80.0–100.0)
Platelets: 257 K/uL (ref 150–400)
RBC: 4.42 MIL/uL (ref 3.87–5.11)
RDW: 13.2 % (ref 11.5–15.5)
WBC: 4.3 K/uL (ref 4.0–10.5)
nRBC: 0 % (ref 0.0–0.2)

## 2024-08-04 LAB — URINALYSIS, ROUTINE W REFLEX MICROSCOPIC
Bilirubin Urine: NEGATIVE
Glucose, UA: NEGATIVE mg/dL
Hgb urine dipstick: NEGATIVE
Leukocytes,Ua: NEGATIVE
Nitrite: NEGATIVE
Specific Gravity, Urine: 1.038 — ABNORMAL HIGH (ref 1.005–1.030)
pH: 6 (ref 5.0–8.0)

## 2024-08-04 LAB — LIPASE, BLOOD: Lipase: 87 U/L — ABNORMAL HIGH (ref 11–51)

## 2024-08-04 MED ORDER — HYDROCODONE-ACETAMINOPHEN 5-325 MG PO TABS: 2.0000 | ORAL_TABLET | Freq: Four times a day (QID) | ORAL | 0 refills | Status: AC | PRN

## 2024-08-04 MED ORDER — ONDANSETRON HCL 4 MG/2ML IJ SOLN
4.0000 mg | Freq: Once | INTRAMUSCULAR | Status: AC
  Administered 2024-08-04: 4 mg via INTRAVENOUS
  Filled 2024-08-04: qty 2

## 2024-08-04 MED ORDER — ONDANSETRON 4 MG PO TBDP
4.0000 mg | ORAL_TABLET | Freq: Once | ORAL | Status: AC | PRN
  Administered 2024-08-04: 4 mg via ORAL
  Filled 2024-08-04: qty 1

## 2024-08-04 MED ORDER — IOHEXOL 300 MG/ML  SOLN
100.0000 mL | Freq: Once | INTRAMUSCULAR | Status: AC | PRN
  Administered 2024-08-04: 100 mL via INTRAVENOUS

## 2024-08-04 MED ORDER — ONDANSETRON 4 MG PO TBDP: 4.0000 mg | ORAL_TABLET | Freq: Three times a day (TID) | ORAL | 0 refills | Status: AC | PRN

## 2024-08-04 NOTE — Discharge Instructions (Addendum)
 CT shows a 3 mm stone that is almost in your urinary bladder.  Make an appointment to follow-up with urology.  Make an appointment follow-up with your regular doctor.  Take the Zofran  ODT as needed for nausea and vomiting.  Take the hydrocodone  as needed for pain.  Return for any new or worse symptoms.

## 2024-08-04 NOTE — Telephone Encounter (Signed)
 Left message for patient to call back if still needs to speak with triage.  Currently she is in the ER w abdominal pain.

## 2024-08-04 NOTE — ED Triage Notes (Signed)
 Pt POV reporting R side abd pain that radiates to back x1 week, endorses nausea. Hx kidney stones, denies urinary sx.

## 2024-08-04 NOTE — ED Provider Notes (Signed)
 [place] EMERGENCY DEPARTMENT AT [facility] Provider Note   CSN: [id number] Arrival date & time: 08/04/24  1541     Patient presents with: Abdominal Pain   Tiffany Young is a 53 y.o. female.   Patient with a 1 week history of right flank right lower quadrant abdominal pain does radiate to the back.  Some nausea no vomiting no diarrhea.  Patient states she never had pain like this before but they did write down that she had a history of kidney stones.  Past medical history though high cholesterol prediabetes carpal tunnel syndrome history of kidney stones gastroesophageal reflux disease past surgical history reduction mammoplasty.  Never used tobacco product.       Prior to Admission medications   Medication Sig Start Date End Date Taking? Authorizing Provider  aspirin EC 81 MG tablet Take 81 mg by mouth daily. Swallow whole.    [provider]  Cholecalciferol (VITAMIN D -3 PO) Take by mouth.    [provider]  Cyanocobalamin  (VITAMIN B12) 1000 MCG TABS Take 1 tablet by mouth daily. 10/21/23   [provider]  Elderberry-Vitamin C-Zinc (ELDERBERRY IMMUNE HEALTH GUMMY PO) Take by mouth daily.    [provider]  fexofenadine (ALLEGRA ALLERGY) 180 MG tablet Take 180 mg by mouth daily. 10/18/23   [provider]  meloxicam  (MOBIC ) 15 MG tablet Take 15 mg by mouth daily as needed. 02/08/24   [provider]  Multiple Vitamins-Minerals (ZINC PO) Take 1 tablet by mouth daily.    [provider]  omeprazole (PRILOSEC) 20 MG capsule Take 20 mg by mouth as needed.     [provider]  rosuvastatin (CRESTOR) 20 MG tablet Take 20 mg by mouth daily.    [provider]    Allergies: Patient has no known allergies.    Review of Systems  Constitutional:  Negative for chills and fever.  HENT:  Negative for ear pain and sore throat.   Eyes:  Negative for pain and visual disturbance.  Respiratory:   Negative for cough and shortness of breath.   Cardiovascular:  Negative for chest pain and palpitations.  Gastrointestinal:  Positive for abdominal pain and nausea. Negative for vomiting.  Genitourinary:  Negative for difficulty urinating, dysuria and hematuria.  Musculoskeletal:  Negative for arthralgias and back pain.  Skin:  Negative for color change and rash.  Neurological:  Negative for seizures and syncope.  All other systems reviewed and are negative.   Updated Vital Signs BP 136/86   Pulse 89   Temp 98.3 F (36.8 C) (Temporal)   Resp 20   Ht 1.651 m (5' 5)   Wt 127 kg   LMP 10/02/2019 (Approximate) Comment: upt neg dos  SpO2 100%   BMI 46.59 kg/m   Physical Exam Vitals and nursing note reviewed.  Constitutional:      General: She is not in acute distress.    Appearance: Normal appearance. She is well-developed.  HENT:     Head: Normocephalic and atraumatic.  Eyes:     Conjunctiva/sclera: Conjunctivae normal.     Pupils: Pupils are equal, round, and reactive to light.  Cardiovascular:     Rate and Rhythm: Normal rate and regular rhythm.     Heart sounds: No murmur heard. Pulmonary:     Effort: Pulmonary effort is normal. No respiratory distress.     Breath sounds: Normal breath sounds.  Abdominal:     General: There is no distension.  Palpations: Abdomen is soft.     Tenderness: There is abdominal tenderness. There is no guarding.     Comments: Right lower quadrant no guarding  Musculoskeletal:        General: No swelling.     Cervical back: Normal range of motion and neck supple.  Skin:    General: Skin is warm and dry.     Capillary Refill: Capillary refill takes less than 2 seconds.  Neurological:     General: No focal deficit present.     Mental Status: She is alert and oriented to person, place, and time.  Psychiatric:        Mood and Affect: Mood normal.     (all labs ordered are listed, but only abnormal results are displayed) Labs Reviewed   LIPASE, BLOOD - Abnormal; Notable for the following components:      Result Value   Lipase 87 (*)    All other components within normal limits  COMPREHENSIVE METABOLIC PANEL WITH GFR - Abnormal; Notable for the following components:   Glucose, Bld 102 (*)    All other components within normal limits  URINALYSIS, ROUTINE W REFLEX MICROSCOPIC - Abnormal; Notable for the following components:   Specific Gravity, Urine 1.038 (*)    Ketones, ur TRACE (*)    Protein, ur TRACE (*)    All other components within normal limits  CBC    EKG: None  Radiology: No results found.   Procedures   Medications Ordered in the ED  ondansetron  (ZOFRAN -ODT) disintegrating tablet 4 mg (4 mg Oral Given 08/04/24 1602)                                    Medical Decision Making Amount and/or Complexity of Data Reviewed Labs: ordered. Radiology: ordered.  Risk Prescription drug management.   CBC is normal complete metabolic panel normal.  Lipase a little elevated at 87 urinalysis negative a little hemoconcentrated.  But nothing consistent with urinary tract infection.  Will get CT scan abdomen pelvis with IV contrast to further evaluate.  Patient is a little tender in the right lower quadrant.  Need to rule out appendicitis kidney stone also possibility also musculoskeletal pain is a possibility but not made worse by moving the right leg.  No numbness or weakness or radiation of pain into the right leg.  Final diagnoses:  Right lower quadrant abdominal pain    ED Discharge Orders     None          Geraldene Hamilton, MD 08/04/24 2056

## 2024-08-07 ENCOUNTER — Encounter: Payer: Self-pay | Admitting: Radiology

## 2024-08-07 NOTE — Telephone Encounter (Signed)
 Pt returning call. Please advise.

## 2024-08-07 NOTE — Telephone Encounter (Signed)
 Spoke w patient.  She found out that her good friend (same age) died of massive MI last week.  She is feeling a sharp twinge in her left chest.  The feeling is fleeting and does not occur w activies, only noticed at rest but occurring daily.  No chest pains, no SOB, no swelling.      We reviewed that she does have calcification.  She is on the Crestor and aspirin daily.  No recent labs.     She wishes to come in for an appointment and would like to be scheduled for a visit to discuss further.  ER visit  - she was dx w kidney stone and continues to have pain.    Pt had to hang up before we could schedule.  I sent her a message w opening for Dr. Lonni 08/21/24

## 2024-08-21 ENCOUNTER — Ambulatory Visit (HOSPITAL_BASED_OUTPATIENT_CLINIC_OR_DEPARTMENT_OTHER): Admitting: Cardiology

## 2024-08-21 NOTE — Progress Notes (Incomplete)
 Cardiology Office Note:  .   Date:  08/21/2024  ID:  Tiffany Young. Tulsi, Crossett [birth year], MRN [id number] PCP: Alys Schuyler HERO, PA  [facility] Providers Cardiologist:  Shelda Bruckner, MD {  History of Present Illness: Tiffany   Tiffany MOTE Young is a 53 y.o. female with PMH hyperlipidemia, coronary calcification, morbid obesity seen for follow up. I met her 02/14/24 as a new consult at the request of [facility] for evaluation of coronary calcification.  CV history:  Noted to have coronary calcification on CT, referred for further evaluation. Per note, was on fish oil and atorvastatin in the past but was not current taking at the time of her visit, was restarted at that time. Labs 10/2023 (off meds) showed Tchol 283, TG 227, HDL 41, calc LDL 198. I cannot see recent CT, but CT chest 2022 noted aortic atherosclerosis and focal LAD calcification.  Today: She is seen for closer follow up on request as she has concerns regarding symptoms and coronary calcification.   ROS: Denies chest pain, shortness of breath at rest or with normal exertion. No PND, orthopnea, LE edema or unexpected weight gain. No syncope or palpitations. ROS otherwise negative except as noted.   Studies Reviewed: Tiffany    EKG:       Physical Exam:   VS:  LMP 10/02/2019 (Approximate) Comment: upt neg dos   Wt Readings from Last 3 Encounters:  08/04/24 280 lb (127 kg)  03/06/24 295 lb (133.8 kg)  02/14/24 299 lb (135.6 kg)    GEN: Well nourished, well developed in no acute distress HEENT: Normal, moist mucous membranes NECK: No JVD CARDIAC: regular rhythm, normal S1 and S2, no rubs or gallops. No murmur. VASCULAR: Radial and DP pulses 2+ bilaterally. No carotid bruits RESPIRATORY:  Clear to auscultation without rales, wheezing or rhonchi  ABDOMEN: Soft, non-tender, non-distended MUSCULOSKELETAL:  Ambulates independently SKIN: Warm and dry, no edema NEUROLOGIC:  Alert and oriented x 3. No focal neuro  deficits noted. PSYCHIATRIC:  Normal affect    ASSESSMENT AND PLAN: .    Coronary calcification Aortic atherosclerosis Mixed hyperlipidemia Obesity, BMI ~50 -We discussed the pathophysiology of cholesterol plaque formation, the role of calcium and why it is a marker, how plaque is key to acute MI/CVA, and how known plaque is managed with medications.   -we discussed the data on statins, both in terms of their long term benefit as well as the risk of side effects. Reviewed common misconceptions about statins. Reviewed how we monitor treatment. After shared decision making, patient is agreeable to trialing statin. She is going to restart the rosuvastatin she has at home. She has tolerated atorvastatin and rosuvastatin in the past. She will recheck lipids with her PCP in 2-3 months.  -discussed guidelines recommending aspirin. She is on this and tolerating, continue -reviewed red flag warning signs that need immediate medical attention  -discussed diet recommendations, artificial sweeteners vs full sugars -LDL goal <70  CV risk counseling and prevention -recommend heart healthy/Mediterranean diet, with whole grains, fruits, vegetable, fish, lean meats, nuts, and olive oil. Limit salt. -recommend moderate walking, 3-5 times/week for 30-50 minutes each session. Aim for at least 150 minutes.week. Goal should be pace of 3 miles/hours, or walking 1.5 miles in 30 minutes -recommend avoidance of tobacco products. Avoid excess alcohol.  Dispo: 1 year or sooner as needed  Signed, Shelda Bruckner, MD   Shelda Bruckner, MD, PhD, [facility] [place]  [facility]    Heart &  Vascular at [facility] at [facility] [address], [address] [place], [place] [address] [phone]
